# Patient Record
Sex: Female | Born: 1980 | Race: White | Hispanic: No | Smoking: Never smoker
Health system: Southern US, Community
[De-identification: ages and names within clinical notes are randomized; demographics above are authoritative.]

## PROBLEM LIST (undated history)

## (undated) DIAGNOSIS — N61 Mastitis without abscess: Secondary | ICD-10-CM

## (undated) DIAGNOSIS — B2790 Infectious mononucleosis, unspecified without complication: Secondary | ICD-10-CM

## (undated) DIAGNOSIS — M436 Torticollis: Secondary | ICD-10-CM

## (undated) DIAGNOSIS — A6920 Lyme disease, unspecified: Secondary | ICD-10-CM

## (undated) DIAGNOSIS — E282 Polycystic ovarian syndrome: Secondary | ICD-10-CM

## (undated) DIAGNOSIS — R55 Syncope and collapse: Secondary | ICD-10-CM

## (undated) DIAGNOSIS — N915 Oligomenorrhea, unspecified: Secondary | ICD-10-CM

## (undated) DIAGNOSIS — N762 Acute vulvitis: Secondary | ICD-10-CM

## (undated) HISTORY — DX: Infectious mononucleosis, unspecified without complication: B27.90

## (undated) HISTORY — DX: Polycystic ovarian syndrome: E28.2

## (undated) HISTORY — PX: TYMPANOSTOMY TUBE PLACEMENT: SHX32

## (undated) HISTORY — DX: Syncope and collapse: R55

## (undated) HISTORY — PX: TUBAL LIGATION: SHX77

## (undated) HISTORY — DX: Acute vulvitis: N76.2

## (undated) HISTORY — PX: WISDOM TOOTH EXTRACTION: SHX21

## (undated) HISTORY — PX: NECK SURGERY: SHX720

## (undated) HISTORY — DX: Lyme disease, unspecified: A69.20

## (undated) HISTORY — DX: Torticollis: M43.6

## (undated) HISTORY — PX: TONSILLECTOMY AND ADENOIDECTOMY: SHX28

## (undated) HISTORY — DX: Oligomenorrhea, unspecified: N91.5

## (undated) HISTORY — DX: Mastitis without abscess: N61.0

---

## 2005-11-09 ENCOUNTER — Other Ambulatory Visit: Admission: RE | Admit: 2005-11-09 | Discharge: 2005-11-09 | Payer: Self-pay | Admitting: Obstetrics and Gynecology

## 2006-08-31 ENCOUNTER — Inpatient Hospital Stay (HOSPITAL_COMMUNITY): Admission: AD | Admit: 2006-08-31 | Discharge: 2006-08-31 | Payer: Self-pay | Admitting: Obstetrics and Gynecology

## 2006-08-31 ENCOUNTER — Inpatient Hospital Stay (HOSPITAL_COMMUNITY): Admission: AD | Admit: 2006-08-31 | Discharge: 2006-09-03 | Payer: Self-pay | Admitting: Obstetrics and Gynecology

## 2006-09-08 ENCOUNTER — Inpatient Hospital Stay (HOSPITAL_COMMUNITY): Admission: AD | Admit: 2006-09-08 | Discharge: 2006-09-08 | Payer: Self-pay | Admitting: Obstetrics and Gynecology

## 2006-09-20 ENCOUNTER — Inpatient Hospital Stay (HOSPITAL_COMMUNITY): Admission: AD | Admit: 2006-09-20 | Discharge: 2006-09-22 | Payer: Self-pay | Admitting: Obstetrics and Gynecology

## 2006-09-22 ENCOUNTER — Ambulatory Visit: Payer: Self-pay | Admitting: Infectious Diseases

## 2008-08-11 ENCOUNTER — Observation Stay (HOSPITAL_COMMUNITY): Admission: AD | Admit: 2008-08-11 | Discharge: 2008-08-11 | Payer: Self-pay | Admitting: Obstetrics and Gynecology

## 2008-08-21 ENCOUNTER — Inpatient Hospital Stay (HOSPITAL_COMMUNITY): Admission: AD | Admit: 2008-08-21 | Discharge: 2008-08-23 | Payer: Self-pay | Admitting: Obstetrics and Gynecology

## 2008-09-17 ENCOUNTER — Inpatient Hospital Stay (HOSPITAL_COMMUNITY): Admission: AD | Admit: 2008-09-17 | Discharge: 2008-09-18 | Payer: Self-pay | Admitting: Obstetrics and Gynecology

## 2008-09-18 ENCOUNTER — Inpatient Hospital Stay (HOSPITAL_COMMUNITY): Admission: AD | Admit: 2008-09-18 | Discharge: 2008-09-20 | Payer: Self-pay | Admitting: Obstetrics and Gynecology

## 2008-09-26 ENCOUNTER — Observation Stay (HOSPITAL_COMMUNITY): Admission: AD | Admit: 2008-09-26 | Discharge: 2008-09-27 | Payer: Self-pay | Admitting: Obstetrics and Gynecology

## 2009-07-23 DIAGNOSIS — R42 Dizziness and giddiness: Secondary | ICD-10-CM | POA: Insufficient documentation

## 2009-07-23 DIAGNOSIS — R0602 Shortness of breath: Secondary | ICD-10-CM | POA: Insufficient documentation

## 2009-07-23 DIAGNOSIS — N61 Mastitis without abscess: Secondary | ICD-10-CM | POA: Insufficient documentation

## 2009-07-23 DIAGNOSIS — R002 Palpitations: Secondary | ICD-10-CM | POA: Insufficient documentation

## 2009-08-07 ENCOUNTER — Ambulatory Visit: Payer: Self-pay | Admitting: Cardiology

## 2009-08-31 ENCOUNTER — Ambulatory Visit: Payer: Self-pay

## 2009-08-31 ENCOUNTER — Ambulatory Visit: Payer: Self-pay | Admitting: Cardiology

## 2009-08-31 ENCOUNTER — Encounter: Payer: Self-pay | Admitting: Cardiology

## 2009-08-31 ENCOUNTER — Ambulatory Visit (HOSPITAL_COMMUNITY): Admission: RE | Admit: 2009-08-31 | Discharge: 2009-08-31 | Payer: Self-pay | Admitting: Cardiology

## 2009-12-04 ENCOUNTER — Inpatient Hospital Stay (HOSPITAL_COMMUNITY): Admission: AD | Admit: 2009-12-04 | Discharge: 2009-12-06 | Payer: Self-pay | Admitting: Obstetrics and Gynecology

## 2009-12-04 ENCOUNTER — Encounter (INDEPENDENT_AMBULATORY_CARE_PROVIDER_SITE_OTHER): Payer: Self-pay | Admitting: Obstetrics and Gynecology

## 2010-04-13 NOTE — Assessment & Plan Note (Signed)
Summary: np6/[redacted] weeks pregnant/dizziness/sob   Visit Type:  new pt visit Referring Provider:  Dierdre Forth Primary Provider:  Ivery Quale  CC:  palpitations...dizziness/headache but says this happens mostly when resting or sitting...pt is 23-[redacted] wks gestation.Marland Kitchen  History of Present Illness: [name] is a delightful 30 year old married white female, referred by Dr. Pennie Rushing, for palpitations.  After her second delivery last year, she had had a PICC line placed for prolonged antibiotics for mastitis. After the placement of this line, which was documented to be in the superior vena cava , she began to have sharp chest pain and palpitations. I reviewed the chest x-ray report today and it was showing the [facility] line in the superior vena cava. I reviewed that with her today.  After the PICC line was removed she stopped having the chest discomfort continued to have intermittent palpitations. They describe a spontaneous, lasting several seconds to a minute, with no associated symptoms of chest discomfort, shortness of breath, or presyncope.  Prior to pregnancy, she did exercise. She had no problems with exercise.  She drinks very little caffeine. She does not smoke or use any alcohol.  There is no history of heart disease or valvular disease.  Her thyroid was checked back in November and was normal.  CBC was obtained February 2011. Hemoglobin was 12.  Current Medications (verified): 1)  Prenatal Vitamins 0.8 Mg Tabs (Prenatal Multivit-Min-Fe-Fa) .Marland Kitchen.. 1 Tab Once Daily  Allergies (verified): 1)  ! Augmentin  Past History:  Past Medical History: Last updated: 07/23/2009 CORONARY ARTERY DISEASE, FAMILY HX (ICD-V17.3) PALPITATIONS (ICD-785.1) SHORTNESS OF BREATH (ICD-786.05) DIZZINESS (ICD-780.4) MASTITIS (ICD-611.0) FM HX OF OTHER CHRONIC RESPIRATORY CONDITIONS (ICD-V17.6)     Past Surgical History: Last updated: 07/23/2009 Gyn surgery  Family History: Last updated:  07/23/2009 Family History of Coronary Artery Disease:  Family History of Diabetes:  Family History of Hypertension:  Fx h/o thyroid disfunction Family History of Cancer:   Review of Systems       negative other than history of present illness  Vital Signs:  Patient profile:   30 year old female Height:      66 inches Weight:      162 pounds BMI:     26.24 Pulse rate:   89 / minute Pulse (ortho):   96 / minute Pulse rhythm:   irregular BP sitting:   102 / 67  (left arm) BP standing:   98 / 70 Cuff size:   large  Vitals Entered By: [name], CMA (Aug 07, 2009 11:13 AM)  Serial Vital Signs/Assessments:  Time      Position  BP       Pulse  Resp  Temp     By 11:19 AM  Lying LA  102/67   91                    [name], CMA 11:19 AM  Sitting   100/64   89                    [name], CMA 11:21 AM  Standing  98/70    96                    [name], CMA 11:22 AM  Standing  104/61   97                    [name], CMA 11:24 AM  Standing  108/65  [address]                    [name], CMA  Comments: 11:19 AM no sxms By: [name], CMA  11:19 AM heads feels fuzzy By: [name], CMA  11:21 AM no sxms By: [name], CMA  11:22 AM no sxms By: [name], CMA  11:24 AM no sxms By: [name], CMA    Physical Exam  General:  Well developed, well nourished, in no acute distress. Head:  normocephalic and atraumatic Eyes:  PERRLA/EOM intact; conjunctiva and lids normal. Neck:  Neck supple, no JVD. No masses, thyromegaly or abnormal cervical nodes. Chest Noelene Gang:  no deformities or breast masses noted Lungs:  Clear bilaterally to auscultation and percussion. Heart:  PMI nondisplaced, normal S1-S2, regular rate and rhythm, no gout Abdomen:  pregnant, good bowel Msk:  Back normal, normal gait. Muscle strength and tone normal. Pulses:  pulses normal in all 4 extremities Extremities:  No clubbing or cyanosis. Neurologic:  Alert and oriented x 3. Skin:   Intact without lesions or rashes. Psych:  Normal affect.   EKG  Procedure date:  08/07/2009  Findings:      normal sinus rhythm, essentially normal EKG  Impression & Recommendations:  Problem # 1:  PALPITATIONS (ICD-785.1) Assessment New I suspect these are benign. Will obtain a 2-D echocardiogram to rule out any structural or functional heart disease. If this is normal, reassurance has been given. See her back on a p.r.n. basis. Orders: EKG w/ Interpretation (93000) Echocardiogram (Echo)  Problem # 2:  CORONARY ARTERY DISEASE, FAMILY HX (ICD-V17.3) Assessment: Unchanged  Problem # 3:  DIZZINESS (ICD-780.4) This is a nonspecific complaint and she is not orthostatic. I've asked her stay well hydrated and have reviewed orthostatic precautions.  Patient Instructions: 1)  Your physician recommends that you schedule a follow-up appointment in: AS NEEDED 2)  Your physician recommends that you continue on your current medications as directed. Please refer to the Current Medication list given to you today. 3)  Your physician has requested that you have an echocardiogram.  Echocardiography is a painless test that uses sound waves to create images of your heart. It provides your doctor with information about the size and shape of your heart and how well your heart's chambers and valves are working.  This procedure takes approximately one hour. There are no restrictions for this procedure.

## 2010-05-27 LAB — CBC
Hemoglobin: 9.4 g/dL — ABNORMAL LOW (ref 12.0–15.0)
MCH: 31 pg (ref 26.0–34.0)
MCHC: 34.3 g/dL (ref 30.0–36.0)
Platelets: 205 10*3/uL (ref 150–400)
RBC: 3.04 MIL/uL — ABNORMAL LOW (ref 3.87–5.11)
RBC: 3.75 MIL/uL — ABNORMAL LOW (ref 3.87–5.11)
WBC: 7.2 10*3/uL (ref 4.0–10.5)
WBC: 8.4 10*3/uL (ref 4.0–10.5)

## 2010-05-27 LAB — RPR: RPR Ser Ql: NONREACTIVE

## 2010-06-20 LAB — CBC
Hemoglobin: 13.2 g/dL (ref 12.0–15.0)
Hemoglobin: 10.7 g/dL — ABNORMAL LOW (ref 12.0–15.0)
MCHC: 34.5 g/dL (ref 30.0–36.0)
MCHC: 34.6 g/dL (ref 30.0–36.0)
MCV: 90.9 fL (ref 78.0–100.0)
MCV: 92.4 fL (ref 78.0–100.0)
Platelets: 209 10*3/uL (ref 150–400)
Platelets: 228 10*3/uL (ref 150–400)
RBC: 4.21 MIL/uL (ref 3.87–5.11)
RBC: 3.18 MIL/uL — ABNORMAL LOW (ref 3.87–5.11)
RDW: 13.7 % (ref 11.5–15.5)
RDW: 14 % (ref 11.5–15.5)
WBC: 8.8 10*3/uL (ref 4.0–10.5)
WBC: 7.5 10*3/uL (ref 4.0–10.5)

## 2010-06-20 LAB — DIFFERENTIAL
Basophils Absolute: 0 10*3/uL (ref 0.0–0.1)
Basophils Absolute: 0 10*3/uL (ref 0.0–0.1)
Basophils Relative: 0 % (ref 0–1)
Basophils Relative: 0 % (ref 0–1)
Basophils Relative: 0 % (ref 0–1)
Eosinophils Absolute: 0.1 10*3/uL (ref 0.0–0.7)
Eosinophils Absolute: 0.1 10*3/uL (ref 0.0–0.7)
Eosinophils Relative: 1 % (ref 0–5)
Lymphs Abs: 1.8 10*3/uL (ref 0.7–4.0)
Monocytes Absolute: 0.6 10*3/uL (ref 0.1–1.0)
Myelocytes: 0 %
Neutro Abs: 6.5 10*3/uL (ref 1.7–7.7)
Neutro Abs: 5 10*3/uL (ref 1.7–7.7)
Neutrophils Relative %: 74 % (ref 43–77)
Neutrophils Relative %: 67 % (ref 43–77)
Neutrophils Relative %: 79 % — ABNORMAL HIGH (ref 43–77)

## 2010-06-20 LAB — CREATININE, SERUM
Creatinine, Ser: 0.57 mg/dL (ref 0.4–1.2)
GFR calc Af Amer: >60 mL/min (ref >60)

## 2010-06-20 LAB — WOUND CULTURE

## 2010-06-21 LAB — CBC
HCT: 25.1 % — ABNORMAL LOW (ref 36.0–46.0)
Hemoglobin: 12.1 g/dL (ref 12.0–15.0)
Hemoglobin: 8.9 g/dL — ABNORMAL LOW (ref 12.0–15.0)
MCV: 93 fL (ref 78.0–100.0)
MCV: 93.8 fL (ref 78.0–100.0)
Platelets: 164 10*3/uL (ref 150–400)
RBC: 2.58 MIL/uL — ABNORMAL LOW (ref 3.87–5.11)
RBC: 2.68 MIL/uL — ABNORMAL LOW (ref 3.87–5.11)
RDW: 14.3 % (ref 11.5–15.5)
WBC: 11.7 10*3/uL — ABNORMAL HIGH (ref 4.0–10.5)
WBC: 9.5 10*3/uL (ref 4.0–10.5)

## 2010-06-21 LAB — RPR: RPR Ser Ql: NONREACTIVE

## 2010-07-27 NOTE — Discharge Summary (Signed)
[name], [name]                 ACCOUNT NO.:  [id number]   MEDICAL RECORD NO.:  [id number]          PATIENT TYPE:  INP   LOCATION:  9314                          [facility]ILITY:  [facility]   PHYSICIAN:  Hal Morales, M.D.DATE OF BIRTH:  1980/12/11   DATE OF ADMISSION:  09/20/2006  DATE OF DISCHARGE:  09/22/2006                               DISCHARGE SUMMARY   DISCHARGING PHYSICIAN:  Erie Noe P. Haygood, MD.   ADMISSION DIAGNOSIS:  Acute mastitis.   DISCHARGE DIAGNOSIS:  Acute mastitis.   HOSPITAL PROCEDURES:  IV antibiotics.   HOSPITAL COURSE:  The patient was admitted with recurrent acute mastitis  on September 20, 2006, with bilateral breast erythema and induration of the  left breast with fever of 103.7.  She was admitted for triple  antibiotics of Ampicillin, Gentamicin, and Clindamycin, and also treated  with Motrin and Tylenol for fever.  She had cultures, which grew out  some gram-negative rods and was changed to Vancomycin.  She had red man  syndrome with the first dose of Vancomycin, which improved with  Vistaril.  On September 21, 2006, she was feeling better, but the left breast  still had erythema, fever was down, and she continued to receive her IV  antibiotics.  White blood cell count was 10 and hemoglobin was 9.7.  She  was seen by Infectious Disease on September 22, 2006, who felt like her  progress was significant and that her mastitis was improving, and he  recommended discharge home with transition to Bactrim-DS b.i.d. for 14  days for further treatment.  On the day of discharge, she was afebrile  x48 hours, neck was supple with no lymph nodes, the right breast was  soft with slight erythema of the nipple, the left breast was soft with  slight erythema of the nipple and at the 8 o'clock position with no  induration, the abdomen was soft and nontender, the incision was clean,  dry, and intact, breast cultures now showed rare gram-positive rods and  rare gram-positive cocci, and  extremities within normal limits.  She was  deemed to have received full benefit of her hospital stay and was  discharged home.   DISCHARGE MEDICATIONS:  1. Bactrim-DS one p.o. b.i.d. x14 days.  2. Diflucan 150 mg p.o. x1 p.r.n. vaginal yeast infection.   DISCHARGE LABORATORY DATA:  Cultures as listed before, and white blood  cell count 10.0, hemoglobin 9.7, platelets 280, creatinine 0.65.   DISCHARGE INSTRUCTIONS:  Spacing out breast pumping to wean from  breastfeeding per patient's desire, monitoring of temperature, and  patient is to call if symptoms worsen or recur.   DISCHARGE FOLLOWUP:  In two weeks or p.r.n.   CONDITION ON DISCHARGE:  Good.      Marie L. Williams, C.N.M.      Hal Morales, M.D.  Electronically Signed    MLW/MEDQ  D:  09/22/2006  T:  09/22/2006  Job:  [id number]

## 2010-07-27 NOTE — Discharge Summary (Signed)
[name], [name]                 ACCOUNT NO.:  [id number]   MEDICAL RECORD NO.:  [id number]          PATIENT TYPE:  INP   LOCATION:  9144                          [facility]ILITY:  [facility]   PHYSICIAN:  Janine Limbo, M.D.DATE OF BIRTH:  [birth year]   DATE OF ADMISSION:  08/31/2006  DATE OF DISCHARGE:  09/03/2006                               DISCHARGE SUMMARY   ADMITTING DIAGNOSES:  1. Intrauterine pregnancy at 40-5/7 weeks.  2. Early labor.   DISCHARGE DIAGNOSES:  1. Intrauterine pregnancy at term.  2. Failure to progress in labor.   PROCEDURES:  1. Primary low transverse cesarean section.  2. Epidural anesthesia.   HOSPITAL COURSE:  Ms. Goyal is a 30 year old gravida 1, para 0, at 23-  3/7 weeks who presented to the office on the morning of August 31, 2006,  with contractions throughout the night.  Cervix at that time was 3 cm,  80%, vertex at a -1 station.  She had a reactive NST and was re-  evaluated approximately 1-2 hours later, was noted to be 3-4, 90%,  vertex at a -1 station.  At that point contractions were every 2 to 2-  1/2 minutes, moderate quality, fetal heart rate was reactive, the  patient did desire to go home.  Risks and benefits of this were reviewed  with the patient and the decision was made to allow the patient to go  home and to return in no longer than 3-4 hours.  She returned then at  approximately 4:20, cervix was 4-5 cm at that time, penicillin  prophylaxis was begun for beta strep and she was admitted.  Her  pregnancy had been remarkable for:  1. Irregular cycles.  2. Questionable PCOS.  3. History of a previa that had resolved.  4. 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  5. Positive Group B Strep.   Through the rest of the evening contractions continued to increase in  quality, patient continued to decline artificial rupture of membranes.  By 7:30, cervix was 5-6, 95% at a 0 station.  Eventually she elected to  allow rupture of membranes to occur, which happened at 9:08 p.m.  At  that time she was fixed, 95% with a 0 station.  She had had somewhat of  a mild arrest of labor at that time, fluid was slightly yellow, blood-  tinged.  She did have some variable decels, IUPC was placed.  [place]  units were noted to be adequate over time.  An IUPC was placed for amnio  infusion.  By 12:50 a.m., patient was 9, completely effaced with vertex  at a 0 station, fetal heart rate was reactive.  Adequate labor was  established.  By 2:15, patient had a fever of 101, she had had an  epidural placed, Unasyn was begun.  By 4:50, cervix was still 9 cm,  complete, with vertex at a -1 station.  Fetal heart rate began to be  tachycardic, fever was continuing, adequate labor had been established.  C-section delivery was recommended.  Dr. Pennie Rushing came  in and also  consulted with the patient.  Decision was made to proceed with C-  section.  The patient was taken to the operating room where a primary  low transverse cesarean section was performed by Dr. Pennie Rushing under  existing epidural anesthesia.  Findings were a viable female by the name  of Franky Macho.  Weight 7 pounds, 8 ounces, Apgars were 8 and 9.  Infant was  taken to the full-term nursery, mother was taken to recovery in good  condition.  By postop day 1 patient was doing well, just having some  slight tearful episodes, given that her expectations for her labor were  different than her outcomes.  However, she was able to articulate that  she was pleased to have a healthy baby.  Breastfeeding was going well.  She was hesitant to take Percocet, this issue was reviewed.  Hemoglobin  was 9.4 down from 13.2, white blood cell count was 8.8 and platelet  count was 151,000.  Her physical exam was within normal limits.  By  postop day 2, patient was continuing to do well overall, although she  was very tired of being in the hospital.  She desired early discharge,  this was  okayed with pediatrician.  The patient denied any postpartum  depression at this time and she planned abstinence at present for birth  control.  Her physical exam was within normal limits, her incision was  clean, dry and intact with subcuticular sutures and Steri-Strips  applied.  Extremities were within normal limits.  She was deemed to have  received full benefit of her hospital stay and was discharged home.   DISCHARGE INSTRUCTIONS:  Per [facility] handout.   DISCHARGE MEDICATIONS:  1. Motrin 600 mg p.o. q.6h. p.r.n. pain.  2. Percocet 1-2 p.o. q.3-4 hours p.r.n. pain.   DISCHARGE FOLLOWUP:  Will occur in 4 weeks at [facility].   I did review with the patient risks of postpartum depression and she is  to call with any issues.      Renaldo Reel Emilee Hero, C.N.M.      Janine Limbo, M.D.  Electronically Signed    VLL/MEDQ  D:  09/03/2006  T:  09/03/2006  Job:  [id number]

## 2010-07-27 NOTE — H&P (Signed)
[name], [name]                 ACCOUNT NO.:  [id number]   MEDICAL RECORD NO.:  [id number]          PATIENT TYPE:  INP   LOCATION:  9302                          [facility]   PHYSICIAN:  Osborn Coho, M.D.   DATE OF BIRTH:  Oct 14, 1980   DATE OF ADMISSION:  09/26/2008  DATE OF DISCHARGE:                              HISTORY & PHYSICAL   Dr. Su Hilt is the doctor of record.   A 30 year old gravida 2, para 2 who presents following a successful VBAC  of an 8 pound infant to [facility] due to chronic mastitis.  She is  4 weeks' postpartum, was seen September 18, 2008 in [facility] for mastitis and was  admitted and observed.  At that point, IV antibiotics, vancomycin was  given and she was discharged home.  She was also treated by  dermatologist in [place] with IM Rocephin and Diflucan.  Culture  of the breast milk shows positive GBS.   ADMISSION DIAGNOSIS:  Chronic mastitis 4 weeks' postpartum.   DISCHARGE DIAGNOSIS:  Unknown.   LABORATORY DATA:  She is A positive, antibody screen negative, VDRL  within normal limits, rubella immune, HBsAg negative, and HIV negative.   On admission, vital signs were 98.6, 86, 20; blood pressure was 117/64,  97.6, pulse 84, 18, and blood pressure 120/50.  The patient states she  is still pumping her breasts, states pain is 4 on a 1-10 pain scale,  pain more on the right breast.   PHYSICAL EXAMINATION:  LUNGS:  Clear bilaterally.  HEART:  Regular rate without murmur.  BREASTS:  The right breast is soft, full, small reddened area around the  areola at 10 o'clock, nipples erect and warm to touch.  The left breast,  no signs of infection, nipples were erect.  Her bleeding is minimal, she  states.  ABDOMEN:  No abdominal pain.  EXTREMITIES:  Negative Homans.   DRUG ALLERGIES:  PENICILLIN and AUGMENTIN, which causes itching.   MEDICATIONS:  She has used in the past medications, p.o. Augmentin,  Keflex, Diflucan, and doxycycline.  IV  antibiotics were used,  gentamicin, clindamycin, and vancomycin.   She was seen by Dr. Estanislado Pandy at 2 p.m. this date and sent over for a PICC  line, in no apparent distress.   ASSESSMENT:  Postpartum vaginal birth after cesarean section successful  with chronic mastitis, admitted for intravenous antibiotics.   PLAN:  1. Admit to third floor for observation, IV of normal saline at 75 mL      (pharmacy states that cephalexin is incompatible with Lactated      Ringer).  2. Regular diet as tolerated.  3. Call if temperature above or equal to 101.  4. Tylenol 650 mg q.8 h. p.r.n.  We will start cephalexin 1 g daily.  5. Ambien 10 mg at bedtime if requested.  6. Dr. Su Hilt aware of admission and orders received.  PICC line will      be ordered by the nurses by Interventional Radiology or IV therapy.      Observe and consult as needed.  Jasmine Awe, CNM      Osborn Coho, M.D.  Electronically Signed    JM/MEDQ  D:  09/26/2008  T:  09/27/2008  Job:  [id number]

## 2010-07-27 NOTE — H&P (Signed)
NAMEKERRIANN, [name]                 ACCOUNT NO.:  [id number]   MEDICAL RECORD NO.:  [id number]          PATIENT TYPE:  INP   LOCATION:  9170                          [facility]   PHYSICIAN:  Hal Morales, M.D.DATE OF BIRTH:  01-19-1981   DATE OF ADMISSION:  08/21/2008  DATE OF DISCHARGE:                              HISTORY & PHYSICAL   [name] is a 30 year old gravida 2, para 1, at 39-1/7 weeks who  presents today with uterine contractions every 2-3 minutes for the last  several hours.  She denies any leaking or bleeding and reports positive  fetal movements.   Pregnancy has been remarkable for:  1. Positive group B strep with the patient penicillin allergic but      group B strep sensitive to clindamycin.  2. The patient is a pharmacist.  3. Previous cesarean section secondary to failure to progress and      nonreassuring fetal heart rate at 9 cm.  4. Paracongenital torticollis.  5. Previous breech presentation with a successful version on May 31.   PRENATAL LABORATORIES:  Blood type is A+, Rh antibody negative, VDRL  nonreactive, rubella titer positive, hepatitis B surface antigen  negative, HIV is nonreactive.  GC and Chlamydia cultures were declined.  Cystic fibrosis testing was negative from her previous pregnancy.  Pap  was normal in December of 2008.  I do not see where another Pap was  done.  The patient had a normal first trimester screen and normal AFP.  She had a normal Glucola.  Hemoglobin upon entering the practice was 12.  It was 11.2 at 28 weeks.  She had a positive group B strep culture done  at 36 weeks.  She had repeat done again with a positive report for  sensitivities and a beta strep was sensitive to penicillin, ampicillin,  levofloxacin, vancomycin and clindamycin.  It was resistant to  erythromycin.   HISTORY OF PRESENT PREGNANCY:  The patient entered care at approximately  9 weeks.  She desired first trimester screen.  She was leaning toward  a  VBAC from the beginning.  She had severe mastitis with hospitalization 2  weeks postpartum and was treated with vancomycin for which she had a red  man syndrome.  She declined flu vaccine.  She had had some first  trimester spotting that resolved in the first trimester.  Ultrasound for  first trimester screen showed anterior placenta and a positive nasal  bone.  EDC by first trimester screen was August 27, 2008.  At 18 weeks,  she had another ultrasound showing posterior placenta.  Dating criteria  was more consistent at her 12-week ultrasound.  She had a normal  Glucola.  She had some perineal irritation from a new soap at 25 weeks.  This was treated with triamcinolone.  VBAC consent form was signed, and  the patient had a visit with Dr. Estanislado Pandy to review the questions.  She  was having some fatigue at 31 weeks.  Positive group B strep was noted  as previously described.  She was frank breech at 37 weeks.  Scheduled  version was done on May 31 and was completed without difficulty.   OBSTETRICAL HISTORY:  In 2008, she had a primary low transverse cesarean  section for a female infant, weight 7 pounds 8 ounces at 40 and 3/7 weeks.  She was in labor 27 hours.  She had epidural anesthesia.  She got to 9  cm and had some combination of failure to progress and fetal distress.  She had a positive group B strep with her first pregnancy.   MEDICAL HISTORY:  1. She reports the usual childhood illnesses.  2. She is a previous [employer] user.  3. She had a UTI x1.  4. She had a fractured elbow at age 87.  5. She was hospitalized 2 weeks postpartum for severe mastitis      following the birth of her baby.   ALLERGIES:  1. SHE IS ALLERGIC TO PENICILLIN WHICH CAUSES HIVES.  2. AUGMENTIN WHICH ALSO CAUSES A RASH.  3. SHE ALSO HAD A RED MAN REACTION TO VANCOMYCIN.   SURGICAL HISTORY:  1. A primary low transverse cesarean section.  2. Tubes in her ears as a child.  3. Adenoids were  removed.  4. She also had right neck surgery.  5. Wisdom teeth removed.   FAMILY HISTORY:  Her paternal grandfather had heart disease.  Father has  hypertension.  Maternal grandmother and paternal grandmother had  emphysema.  Paternal grandfather had borderline diabetes.  Paternal  grandmother had hypothyroidism.  Paternal grandmother had lung cancer.  Maternal grandfather had lung cancer.  Mother and sister have migraines.  Paternal grandmother and paternal grandfather were smokers.   GENETIC HISTORY:  Remarkable for the patient's uncle having a heart  murmur and the patient's first cousin having twins.   SOCIAL HISTORY:  The patient is married to the father of the baby.  He  is involved and supportive.  His name is Naysa Puskas.  The patient is  Caucasian, of the [place] faith.  She is graduate educated.  She is a  Art therapist.  The patient's husband is also graduate educated.  He  is a physical therapist and part owner of a restaurant.  She has been  followed by the certified nurse midwife service of [place].  She denies any alcohol, drug or tobacco use during this pregnancy.   PHYSICAL EXAMINATION:  VITAL SIGNS:  Stable.  The patient is febrile.  HEENT: Within normal limits.  LUNGS:  Breath sounds are clear.  HEART:  Regular rate and rhythm without murmur.  BREASTS:  Soft and nontender.  ABDOMEN:  Fundal height is approximately 39 cm.  Estimated fetal weight  is 7 to 7-1/2 pounds.  Uterine contractions every 2-3 minutes, moderate  to strong quality.  PELVIC:  Cervix is 4+, 100% vertex at a zero station with intact bag of  water.  Fetal heart rate is reassuring with a negative spontaneous CST.  EXTREMITIES:  Deep tendon reflexes are 2+ without clonus.  There is  trace edema noted.   IMPRESSION:  1. Intrauterine pregnancy at 39-1/7 weeks.  2. Early active labor.  3. Positive group B strep with penicillin allergic, but culture      sensitive to  clindamycin.  4. Previous cesarean section with desire for vaginal birth after      cesarean section.   PLAN:  1. Admit to birthing suite per consult with Dr. Pennie Rushing as attending      physician.  2. Routine certified nurse midwife  orders.  3. The patient desires VBAC.  Consent on chart.  Risks and benefits      were reviewed including failure of trial of      labor, need for C-section, uterine rupture with maternal fetal      compromise.  The patient and her husband seem to understand these      risks and wish to proceed with VBAC trial.  4. The patient desires epidural.      Renaldo Reel. Emilee Hero, C.N.M.      Hal Morales, M.D.  Electronically Signed    VLL/MEDQ  D:  08/21/2008  T:  08/21/2008  Job:  [id number]

## 2010-07-27 NOTE — H&P (Signed)
NAMETELECIA, [name]                 ACCOUNT NO.:  [id number]   MEDICAL RECORD NO.:  [id number]          PATIENT TYPE:  INP   LOCATION:  9173                          [facility]ILITY:  [facility]   PHYSICIAN:  Hal Morales, M.D.DATE OF BIRTH:  1980-09-18   DATE OF ADMISSION:  08/31/2006  DATE OF DISCHARGE:                              HISTORY & PHYSICAL   This is a 30 year old gravida 1, para 0, at 40-3/7 weeks who presents in  labor.  She was seen several hours ago for a labor check and requested  to go home.  Her cervix at that time was 3-4 cm and is now 4-5 cm and  she is presenting for admission.  Pregnancy has been followed by the  midwife service and remarkable for:  1. Irregular cycles.  2. Questionably with PCOS.  3. History of previa which is now resolved.  4. 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  5. Group B strep positive.   ALLERGIES:  None.   OB HISTORY:  Patient is primigravida.   MEDICAL HISTORY:  1. Remarkable for being told in the past she has PCOS.  2. She also has a history of torticollis as a baby which was      surgically corrected.   SURGICAL HISTORY:  Remarkable for:  1. Tubes in her ears.  2. Adenoids removed.  3. Neck surgery.  4. Wisdom teeth.   FAMILY HISTORY:  Remarkable for:  1. Grandfather with heart disease.  2. Father with hypertension.  3. Two grandmothers with emphysema.  4. Grandfather with diabetes.  5. Grandmother with hyperthyroidism.  6. Grandmother with breast cancer.  7. Grandfather with lung cancer.   GENETIC HISTORY:  Remarkable for an uncle with heart murmur and a first  cousin with twins.   SOCIAL HISTORY:  1. Patient is married to Leanna Sato who is involved and supportive.  2. She is of the [place] faith.  3. She denies any alcohol, tobacco or drug use.  4. She works as a Teacher, early years/pre.   PRENATAL LABS:  Hemoglobin 13.7, platelets 215,000 and blood type A  positive.  Antibody screen negative, RPR  nonreactive, rubella immune,  hepatitis negative, HIV negative, Pap test normal, gonorrhea negative,  Chlamydia negative, cystic fibrosis negative.   HISTORY OF CURRENT PREGNANCY:  1. Patient entered care at [redacted] weeks gestation.  2. She had a 1st trimester screen that was normal.  3. She had an ultrasound at 19 weeks for anatomy which was normal.  4. She declined followup AFP.  5. She had a Glucola at 27 weeks that was normal.  6. She was Group B strep positive at term.   OBJECTIVE DATA:  VITAL SIGNS:  Stable, afebrile.  HEENT:  Within normal limits.  THYROID:  Normal, not enlarged.  CHEST:  Clear to auscultation.  HEART:  Regular rate and rhythm.  ABDOMEN:  Gravid at 39 cm.  VERTEX:  __________.  CFM:  Shows reactive fetal heart rate with contractions every 2-3  minutes, cervix is now 4-5 cm, 95% effaced, -1 station with a vertex  presentation.  EXTREMITIES:  Within normal limits.   ASSESSMENT:  1. Intrauterine pregnancy at 40-3/7 weeks.  2. Active labor.  3. Group B streptococcus positive.   PLAN:  1. Admit to birthing suite.  2. Routine CNM orders.  3. Group B strep prophylaxis.  4. Further orders to follow.      Marie L. Williams, C.N.M.      Hal Morales, M.D.  Electronically Signed    MLW/MEDQ  D:  08/31/2006  T:  08/31/2006  Job:  [id number]

## 2010-07-27 NOTE — H&P (Signed)
[name], [name]                 ACCOUNT NO.:  [id number]   MEDICAL RECORD NO.:  [id number]          PATIENT TYPE:  INP   LOCATION:  9314                          [facility]ILITY[facility]  [facility]   PHYSICIAN:  Osborn Coho, M.D.   DATE OF BIRTH:  Feb 04, 1981   DATE OF ADMISSION:  09/20/2006  DATE OF DISCHARGE:                              HISTORY & PHYSICAL   This is a 30 year old gravida 1 para 1 who is status post cesarean  section on August 31, 2006 for failure to progress, who presents with left  mastitis.  She was treated for right mastitis 2 weeks ago with Keflex  and did not improve on that and was changed to dicloxacillin.  She  developed left breast pain 1-2 days ago and has been pumping every 2  hours, with worsening fever over the last 24 hours.   ALLERGIES:  None.   OB HISTORY:  Remarkable for a low transverse cesarean section on August 31, 2006 for failure to progress.  She had no complications from that.   MEDICAL HISTORY:  1. PCOS.  2. History of torticollis as a baby, which was surgically corrected.   SURGICAL HISTORY:  1. Tubes in her ears.  2. Adenoids removed.  3. Neck surgery.  4. Wisdom teeth.   FAMILY HISTORY:  Remarkable for a grandfather with heart disease, father  with hypertension, two grandmothers with emphysema, grandfather with  diabetes, grandmother with hyperthyroidism, grandmother with breast  cancer, and grandfather with lung cancer.   GENETIC HISTORY:  Remarkable for an uncle with a heart murmur, and a  first cousin with twins.   SOCIAL HISTORY:  The patient is married to [place], who is involved and  supportive.  She is of [place] faith.  She denies any alcohol,  tobacco, or drug use.  She works as a Teacher, early years/pre.   PRENATAL LABORATORIES:  Hemoglobin 13.7.  Blood type A positive.  Other  tests normal.   HISTORY OF CURRENT PREGNANCY:  The patient entered care at [redacted] weeks  gestation.  Had a normal pregnancy, and presented in labor at 40 weeks,  and did  not progress, and subsequently had a cesarean delivery.   OBJECTIVE DATA:  VITAL SIGNS:  Stable.  Fever is 103.7.  NECK:  Supple, with one auricular node palpable on the left.  BREASTS:  Bilateral breasts with erythema, and induration on the left  breast, with yellow crusting on the left nipple.  Milk is able to be  expressed bilaterally.  ABDOMEN:  Soft and nontender.  GU:  Lochia is scant.  EXTREMITIES:  Within normal limits.  PELVIC:  Deferred.   ASSESSMENT:  1. Recurrent acute mastitis.  2. Nineteen days post cesarean section.   PLAN:  1. Dr. Su Hilt was consulted.  2. We will admit to [facility] for IV antibiotic therapy.  3. Triple antibiotics, to include ampicillin, gentamicin, and      clindamycin.  4. Further orders to follow.      Marie L. Williams, C.N.M.      Osborn Coho, M.D.  Electronically Signed    MLW/MEDQ  D:  09/20/2006  T:  09/20/2006  Job:  [id number]

## 2010-07-27 NOTE — Discharge Summary (Signed)
[name]                 ACCOUNT NO.:  [id number]   MEDICAL RECORD NO.:  [id number]          PATIENT TYPE:  INP   LOCATION:  9302                          [facility]  [facility]   PHYSICIAN:  [name], M.D.   DATE OF BIRTH:  1981-03-11   DATE OF ADMISSION:  09/26/2008  DATE OF DISCHARGE:  09/27/2008                               DISCHARGE SUMMARY   ADMISSION DIAGNOSIS:  Recurrent mastitis.   DISCHARGE DIAGNOSIS:  Recurrent mastitis, untreated.   PROCEDURE:  Insertion of peripherally inserted central catheter line.   HOSPITAL COURSE:  [name] is a 30 year old gravida 2, para 2, who was  admitted due to recurrent right breast mastitis, unresponsive to  outpatient management.  The patient had previously been admitted 1 week  ago for same issues and was discharged home on doxycycline.  At the time  of her admission one week ago, the left breast was the affected breast,  however on Friday prior to admission, the patient began having issues  with her right breast with redness and onset of fever as well as  bodyaches and chills.  The patient was admitted for IV antibiotics and  further evaluation.  A plan was implemented after consult with Dr.  Lina Sayre in Infectious Disease to have the patient obtain a PICC  line in order to receive outpatient IV antibiotics due to persistent  issues with mastitis despite outpatient management.  The patient has  used several oral antibiotics over the past few weeks.  The patient was  afebrile upon admission.  On admission, the patient's WBC 8.8,  hemoglobin 13.2, hematocrit 38.3, platelets 232,000 with a normal  differential.  The patient with no nausea, vomiting, or other complaints  upon admission.   On hospital day #1, the patient underwent placement of a PICC line in  the right upper extremity without difficulty.  The patient remained  afebrile throughout her hospital stay.  It was felt that the patient has  received full benefit of  her stay and was discharged home after home  infusion therapy was arranged with [facility].  The patient's  physical exam remained negative and on hospital day #1, a very faint  amount of redness remained present on the right breast.   DISCHARGE INSTRUCTIONS:  The patient to maintain IV antibiotics per home  healthcare.  The patient will continue with prenatal vitamins daily and  Motrin as needed for pain.   DISCHARGE MEDICATIONS:  1. Rocephin 1 g every 24 hours.  2. Ibuprofen 600 mg p.o. q.6-8 h. p.r.n. pain.  3. Percocet 1-2 tabs every 4-6 hours as need for pain.  The patient is already on the above noted medicines with the except of  Rocephin prior to admission.   FOLLOWUP:  Discharge followup will occur in 2-1/2 weeks at [facility].  The patient was instructed to call to office if she  began experiencing increased redness, fever, or pain in the breast or in  the PICC line insertion area prior to her next visit or discontinuation  of the central line.  [name], CNM      [name], M.D.  Electronically Signed    NOS/MEDQ  D:  09/27/2008  T:  09/28/2008  Job:  [id number]

## 2010-07-27 NOTE — Discharge Summary (Signed)
[name], SCHLOSSER                 ACCOUNT NO.:  [id number]   MEDICAL RECORD NO.:  [id number]          PATIENT TYPE:  INP   LOCATION:  9307                          [facility]   PHYSICIAN:  Janine Limbo, M.D.DATE OF BIRTH:  12/18/1980   DATE OF ADMISSION:  09/18/2008  DATE OF DISCHARGE:  09/20/2008                               DISCHARGE SUMMARY   ADMISSION DIAGNOSIS:  Postpartum mastitis, unresponsive to outpatient  therapy.   DISCHARGE DIAGNOSIS:  Postpartum mastitis, improved.   PROCEDURE:  IV antibiotics.   HOSPITAL COURSE:  Mr. Roberti is a 30 year old gravida 2, para 2, status  post vaginal delivery on August 22, 2008.  The patient with a history of  severe mastitis with her previous pregnancy in 2008, requiring IV  vancomycin after multiple p.o. antibiotic failures.  In the past 2 weeks  prior to admission, the patient had been treated with multiple  antibiotics as well as Diflucan.  The patient is with elevated  temperature on the day of admission.  The patient is with increased  breast complaints as well at the time of the admission.   At the time of admission, the patient's temperature was 100 degrees.  White count 8.8, hemoglobin 11.4, hematocrit 32.9, and platelets  209,000.  The left breast is with 8 x 7 cm area of redness.  Because of  the patient's history, a decision was made for inpatient hospitalization  and IV antibiotics.  A consult was obtained with Dr. Maurice March, Infectious  Disease.  The patient was started on vancomycin at the time of  admission.  From the time of admission to the time of discharge after  her initial temperature of 100 degrees, the patient with no further  elevations in temperature.  The patient is with decreased redness and  the patient reports decreased pain.  Infectious Disease recommended  treatment with doxycycline, which is incompatible with breast feeding.  Therefore, the patient has elected to discontinue breast-feeding due to  the persistence of this infection and recurrent nature of the infection.  On September 20, 2008, day of discharge, the patient remained afebrile and  reported that she was feeling much better.  The patient was tolerating  p.o. liquids and solids without difficulty.  The patient is with no  perineal complaints.  Normal lochia and no abdominal pain.   It was felt the patient had received full benefit of her hospital stay  and was discharged home.   DISCHARGE INSTRUCTIONS:  The patient will call if she has fever greater  than 100.5 and an increased breast firmness or redness.   DISCHARGE MEDICATIONS:  1. Doxycycline 100 mg p.o. b.i.d. for 14 days.  2. Motrin 600 mg 1 tablet every 6 hours as needed for pain.  3. Vicodin 1-2 tablets every 4-6 hours as needed.  4. The patient will try to obtain Dr. Allene Pyo nipple cream to use as      needed.   DISCHARGE FOLLOWUP:  Discharge followup will occur at [facility] in 2 weeks.      [name], CNM  Janine Limbo, M.D.  Electronically Signed    NOS/MEDQ  D:  09/21/2008  T:  09/21/2008  Job:  [id number]

## 2010-07-27 NOTE — Discharge Summary (Signed)
[name], [name]                 ACCOUNT NO.:  [id number]   MEDICAL RECORD NO.:  [id number]          PATIENT TYPE:  INP   LOCATION:  9132                          [facility]   PHYSICIAN:  Hal Morales, M.D.DATE OF BIRTH:  09/18/1980   DATE OF ADMISSION:  08/21/2008  DATE OF DISCHARGE:  08/23/2008                               DISCHARGE SUMMARY   ADMISSION DIAGNOSES:  1. Intrauterine pregnancy at 22 and 1/7 weeks.  2. Early active labor.  3. Positive group B strep, sensitive to clindamycin.  4. Previous cesarean section with desire for vaginal birth after      cesarean section.   DISCHARGE DIAGNOSES:  1. Intrauterine pregnancy at 37 and 2/7 weeks, delivered.  2. Vaginal birth after cesarean section, successful.   HOSPITAL COURSE:  [name] is a 30 year old gravida 2, para 1,  admitted at 60 and 1/7 weeks' gestation with an early active labor.  The  patient's pregnancy remarkable for positive Group B strep, sensitive to  clindamycin, previous cesarean section secondary to failure to progress  and nonreassuring heart rate, previous breech presentation with  successful version on May 31, para congenital torticollis.   Upon admission, the patient with regular uterine contractions.  Initial  cervical exam 4 cm.  The patient again elected to proceed with vaginal  birth after cesarean attempt and the patient had previously been  counseled by MD and had signed consent for sign.  The patient received  an epidural and the patient's labor progressed.  The patient had a  successful vaginal delivery on August 22, 2008.  The patient has  spontaneous vaginal delivery of a viable female infant.  Second-degree  laceration was repaired in the usual fashion.  The patient did have  increased bleeding, immediately postdelivery and was given Cytotec 800  mcg.  Estimated blood loss 450 mL.   On later on postpartum day #0, hemoglobin was noted to be 8.6 and it was  12.1 prior to delivery.   The patient with dizziness with ambulation at  that time as well as sitting.  The patient was treated with IV hydration  and was given option of transfusion due to her symptoms, however, she  declined.  The patient's dizziness resolved with IV hydration.   On postpartum day #1, the patient reported feeling much improved and was  ambulating, voiding, and tolerating p.o. liquids and solids without  difficulty.  The patient denied any significant pain and requested  discharge.  The patient remained afebrile.  Repeat hemoglobin on  postpartum day #1 stable at 8.9.  She was deemed to have received full  benefit of her hospital stay and was discharged home.   Of note, the patient with Clinical Social Work consult due to history of  anxiety and relationship issues, however, at that time the patient did  not express any significant issues.   DISCHARGE INSTRUCTIONS:  Per [facility] handout.   DISCHARGE MEDICATIONS:  1. Maxaron forte 1 tablet daily.  2. Prenatal vitamin 1 tablet daily.  3. Motrin 600 mg 1 tablet every 6 hours as needed  for pain.  4. Tylox 1-2 tablets every 4-6 hours as needed for pain.   Discharge followup will occur at [facility] in 6 weeks.  The patient undecided regarding method of contraception and risks,  benefits, alternatives were discussed with the patient prior to her  discharge and will discuss that her at 6-week visit.      [name], CNM      Hal Morales, M.D.  Electronically Signed    NOS/MEDQ  D:  09/21/2008  T:  09/21/2008  Job:  [id number]

## 2010-07-27 NOTE — Op Note (Signed)
[name], [name]                 ACCOUNT NO.:  [id number]   MEDICAL RECORD NO.:  [id number]          PATIENT TYPE:  INP   LOCATION:  9144                          [facility]   PHYSICIAN:  Hal Morales, M.D.DATE OF BIRTH:  1980-08-03   DATE OF PROCEDURE:  09/01/2006  DATE OF DISCHARGE:                               OPERATIVE REPORT   PREOPERATIVE DIAGNOSIS:  Intrauterine pregnancy at term, failure to  progress in labor.   POSTOPERATIVE DIAGNOSIS:  Intrauterine pregnancy at term, failure to  progress in labor.   OPERATION:  Primary low transverse cesarean section.   SURGEON:  Dr. Dierdre Forth   FIRST ASSISTANT:  Wynelle Bourgeois, certified nurse midwife.   ANESTHESIA:  Epidural.   ESTIMATED BLOOD LOSS:  750 mL.   COMPLICATIONS:  None.   FINDINGS:  The patient was delivered of a female infant whose name is  Franky Macho, weighing 7 pounds 8 ounces with Apgars of 8 and 9 at 1 and 5  minutes respectively.  The  uterus, tubes and ovaries were normal for  the gravid state.   PROCEDURE:  The patient was taken to the operating room after  appropriate identification and after a discussion had been held with the  patient and her husband concerning the indications for the procedure as  well as the risks involved which include but are not limited to  anesthesia, bleeding, infection, damage to adjacent organs.  The patient  had her labor epidural and Foley catheter in place.  She was placed on  the operating table and her labor epidural dosed for surgical  anesthesia.  The abdomen was prepped with multiple layers of Betadine  and draped as a sterile field.  After assurance of adequate anesthesia  the skin was infiltrated with a dose of quarter percent Marcaine 15 mL.  Transverse incision was made in the abdomen and the abdomen opened in  layers.  The peritoneum was entered and the bladder blade placed.  The  uterus was incised approximately 2 cm above the uterovesical fold and  that  incision taken laterally on either side bluntly.  The infant was  delivered from the occiput transverse position and after having the  nares and pharynx suctioned and the cord clamped and cut was handed off  to the awaiting pediatricians.  Meconium-stained amniotic fluid was  noted and a DeLee suction was used.  The placenta was noted to have  separated from the uterus and was removed from the operative field.  It  was given to the [facility] Cord Blood [facility] staff for cord blood bank  collection.  The uterus was then closed with a running interlocking  suture of 0 Vicryl.  An imbricating suture of 0 Vicryl was then placed  with adequate hemostasis.  Copious irrigation was carried out.  The  abdominal peritoneum was closed with a running suture of 2-0 Vicryl.  The rectus muscles were reapproximated in the midline with figure-of-  eight suture of 2-0 Vicryl.  The rectus fascia was closed with running  suture of 0 Vicryl and reinforced on either side of midline  with figure-  of-eight sutures of 0 Vicryl.  The subcutaneous tissue was made  hemostatic with Bovie cautery and irrigated.  The skin incision was  closed with a subcuticular suture of 3-0 Monocryl.  Steri-Strips were  applied.  A sterile dressing applied.  The patient was taken from the  operating room to  the recovery room in satisfactory condition having tolerated the  procedure well with sponge and instrument counts correct.   SPECIMEN:  The placenta went to birthing suite after cord blood  collection.      Hal Morales, M.D.  Electronically Signed     VPH/MEDQ  D:  09/01/2006  T:  09/01/2006  Job:  [id number]

## 2010-12-28 LAB — DIFFERENTIAL
Basophils Relative: 0
Eosinophils Absolute: 0.2
Lymphs Abs: 1.9
Monocytes Absolute: 0.5
Monocytes Relative: 5

## 2010-12-28 LAB — WOUND CULTURE

## 2010-12-28 LAB — CREATININE, SERUM: GFR calc Af Amer: >60

## 2010-12-28 LAB — CBC
HCT: 28.7 — ABNORMAL LOW
MCHC: 33.8
MCV: 91.8
Platelets: 280
RDW: 13.7

## 2010-12-28 LAB — ANAEROBIC CULTURE

## 2010-12-29 LAB — CBC
HCT: 27.4 — ABNORMAL LOW
Hemoglobin: 13.2
Hemoglobin: 9.4 — ABNORMAL LOW
MCHC: 34.4
MCHC: 34.5
MCV: 89.9
MCV: 91.2
Platelets: 151
RBC: 3.01 — ABNORMAL LOW
RDW: 13.7
RDW: 13.8
WBC: 8.8

## 2010-12-29 LAB — RPR: RPR Ser Ql: NONREACTIVE

## 2011-01-04 ENCOUNTER — Encounter: Payer: Self-pay | Admitting: Internal Medicine

## 2011-01-05 ENCOUNTER — Encounter: Payer: Self-pay | Admitting: Internal Medicine

## 2011-01-20 ENCOUNTER — Encounter: Payer: Self-pay | Admitting: Physician Assistant

## 2011-01-20 ENCOUNTER — Encounter: Payer: Self-pay | Admitting: *Deleted

## 2011-01-21 ENCOUNTER — Encounter: Payer: Self-pay | Admitting: Physician Assistant

## 2011-01-21 ENCOUNTER — Ambulatory Visit (INDEPENDENT_AMBULATORY_CARE_PROVIDER_SITE_OTHER): Payer: Commercial Managed Care - PPO | Admitting: Physician Assistant

## 2011-01-21 VITALS — BP 120/74 | HR 94 | Resp 18 | Ht 66.0 in | Wt 135.1 lb

## 2011-01-21 DIAGNOSIS — R42 Dizziness and giddiness: Secondary | ICD-10-CM

## 2011-01-21 NOTE — Progress Notes (Signed)
History of Present Illness: PCP:  Dr. Jarome Matin Primary Cardiologist:  Dr. Valera Castle   [name] is a 30 y.o. female who has a h/o palpitations.  She was evaluated by Dr. Daleen Squibb in 5/11.  Echo 6/11: EF 65%, normal diastolic function.  She was to follow up prn.    Over the last several weeks, she has noted feelings of lightheadedness.  They generally occur while sitting and primarily after lunch at work.  She feels a discomfort in her chest with this.  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  She exercises when she can.  She denies exertional chest pain or dyspnea.  No orthopnea or PND.  No syncope.  She had one episode of near syncope at work.  States she saw something "gross" and felt flushed and feels like it was a vagal response.  Her BP was 92/62 at her Ob's office yesterday.  She had labs drawn: CMP, Hgb, TSH.  Of note, she has been told she is anemic and only takes Iron when she can remember it.    Past Medical History  Diagnosis Date  . Mastitis   . Pre-syncope   . Hypotension   . PCOS (polycystic ovarian syndrome)   . Torticollis     as a baby, surgically corrected    Medications No current outpatient prescriptions on file.    Allergies: Allergies  Allergen Reactions  . Bactrim     Fever, muscle ache.  . Penicillins Hives  . Vancomycin     RED MAN REACTION  . Augmentin Rash    History  Substance Use Topics  . Smoking status: Never Smoker   . Smokeless tobacco: Not on file  . Alcohol Use: No     ROS:  Please see the history of present illness.   Hematological and Lymphatic ROS: positive for - anemia.  Dermatological ROS: positive for nail changes.   All other systems reviewed and negative.   Vital Signs: BP 120/74  Pulse 94  Resp 18  Ht 5\' 6"  (1.676 m)  Wt 135 lb 1.9 oz (61.29 kg)  BMI 21.81 kg/m2  Filed Vitals:   01/21/11 0948 01/21/11 0951 01/21/11 0958 01/21/11 1001  BP: 116/77 113/72 112/74 120/74  Pulse: 77 82 93 94  Resp:       Height:      Weight:         PHYSICAL EXAM: Well nourished, well developed, in no acute distress HEENT: normal Neck: no JVD Endocrine: no thyromegaly Vascular: no carotid bruits Cardiac:  normal S1, S2; RRR; no murmur Lungs:  clear to auscultation bilaterally, no wheezing, rhonchi or rales Abd: soft, nontender, no hepatomegaly Ext: no edema Skin: warm and dry Neuro:  CNs 2-12 intact, no focal abnormalities noted Psych: normal affect  EKG:  NSR, HR 71, normal axis, no ischemic changes  ASSESSMENT AND PLAN:

## 2011-01-21 NOTE — Patient Instructions (Signed)
Your physician recommends that you schedule a follow-up appointment in: 6 WEEKS WITH DR. WALL PER SCOTT WEAVER, PA-C  Your physician has recommended that you wear an event monitor DX 780.4 DIZZINESS THIS NEEDS TO BE DONE BY SOMETIME NEXT WEEK. Event monitors are medical devices that record the heart's electrical activity. Doctors most often [place] these monitors to diagnose arrhythmias. Arrhythmias are problems with the speed or rhythm of the heartbeat. The monitor is a small, portable device. You can wear one while you do your normal daily activities. This is usually used to diagnose what is causing palpitations/syncope (passing out).  WE WILL GET YOUR LAB FROM DR. HAYWOOD AND DEPENDING ON THOSE LAB RESULTS WE WILL LET YOU KNOW IF YOU NEED TO KEEP THE APPT WITH DR. Daleen Squibb

## 2011-01-21 NOTE — Progress Notes (Signed)
Labs from Dr Mikey Bussing received.   Hgb 12.2, Na 140, K 4.6, creatinine 0.52, ALT 14, TSH 0.906, Vit D 36. Therefore patient will proceed with event monitor and follow up with Dr. Daleen Squibb. Tereso Newcomer, PA-C

## 2011-01-21 NOTE — Assessment & Plan Note (Signed)
Symptoms sound more c/w a metabolic issue than anything else.  I am somewhat suspicious of PAC/PVCs or some type of arrhythmia.  She had a normal echo last year.  I have suggested we obtain her labs drawn by Dr. Mikey Bussing.  I will arrange an event monitor and follow up with Dr. Daleen Squibb.  If her labs reveal a significant metabolic or hematologic issue, we can hold off on the monitor until these issues are addressed.  She is not significantly orthostatic.  Her HR does increase some.  I have suggested she continue to hydrate herself well.

## 2011-01-26 ENCOUNTER — Telehealth: Payer: Self-pay

## 2011-03-04 ENCOUNTER — Ambulatory Visit: Payer: Commercial Managed Care - PPO | Admitting: Cardiology

## 2011-06-02 NOTE — Telephone Encounter (Signed)
NCB

## 2011-10-07 ENCOUNTER — Telehealth: Payer: Self-pay | Admitting: Obstetrics and Gynecology

## 2011-10-07 NOTE — Telephone Encounter (Signed)
10/07/11-contd 12/05/2009.

## 2011-10-07 NOTE — Telephone Encounter (Signed)
[name]/[name] °

## 2011-10-07 NOTE — Telephone Encounter (Signed)
Tc to pt per telephone call. Lm on vm giving results of WBC=7.2 on

## 2012-01-17 ENCOUNTER — Ambulatory Visit (INDEPENDENT_AMBULATORY_CARE_PROVIDER_SITE_OTHER): Payer: Commercial Managed Care - PPO | Admitting: Obstetrics and Gynecology

## 2012-01-17 ENCOUNTER — Encounter: Payer: Self-pay | Admitting: Obstetrics and Gynecology

## 2012-01-17 VITALS — BP 100/66 | Ht 65.5 in | Wt 132.0 lb

## 2012-01-17 DIAGNOSIS — Z01419 Encounter for gynecological examination (general) (routine) without abnormal findings: Secondary | ICD-10-CM

## 2012-01-17 DIAGNOSIS — Z124 Encounter for screening for malignant neoplasm of cervix: Secondary | ICD-10-CM

## 2012-01-17 DIAGNOSIS — N915 Oligomenorrhea, unspecified: Secondary | ICD-10-CM | POA: Insufficient documentation

## 2012-01-17 DIAGNOSIS — N762 Acute vulvitis: Secondary | ICD-10-CM | POA: Insufficient documentation

## 2012-01-17 NOTE — Progress Notes (Signed)
ANNUAL:  Last Pap: 01/14/2010   WNL: Yes.  hpv not done Regular Periods:yes Contraception: BTL Monthly Breast exam:no Tetanus<29yrs:yes Nl.Bladder Function:yes Daily BMs:yes Healthy Diet:yes Calcium:no Mammogram:no Date of Mammogram: n/a Exercise:yes Have often Exercise: occasional Seatbelt: yes Abuse at home: no Stressful work:no Sigmoid-colonoscopy: n/a Bone Density: No PCP: Dr. Juluis Rainier Change in PMH: None Change in [facility]: None  Subjective:    [name] is a 31 y.o. female, G3P3, who presents for an annual exam.     History   Social History  . Marital Status: Married    Spouse Name: N/A    Number of Children: N/A  . Years of Education: N/A   Social History Main Topics  . Smoking status: Never Smoker   . Smokeless tobacco: Never Used  . Alcohol Use: No  . Drug Use: No  . Sexually Active: Yes    Birth Control/ Protection: Surgical     Comment: BTL   Other Topics Concern  . None   Social History Narrative  . None    Menstrual cycle:   LMP: No LMP recorded.           Cycle: monthly x5.  Some premen  The following portions of the patient's history were reviewed and updated as appropriate: allergies, current medications, past family history, past medical history, past social history, past surgical history and problem list.  Review of Systems Pertinent items are noted in HPI. Breast:Negative for breast lump,nipple discharge or nipple retraction Gastrointestinal: Negative for abdominal pain, change in bowel habits or rectal bleeding Urinary:negative   Objective:    There were no vitals taken for this visit.    Weight:  Wt Readings from Last 1 Encounters:  01/21/11 135 lb 1.9 oz (61.29 kg)          BMI: There is no height or weight on file to calculate BMI.  General Appearance: Alert, appropriate appearance for age. No acute distress HEENT: Grossly normal Neck / Thyroid: Supple, no masses, nodes or enlargement Lungs: clear to auscultation  bilaterally Back: No CVA tenderness Breast Exam: No masses or nodes.No dimpling, nipple retraction or discharge. Cardiovascular: Regular rate and rhythm. S1, S2, no murmur Gastrointestinal: Soft, non-tender, no masses or organomegaly Pelvic Exam: Vulva and vagina appear normal. Bimanual exam reveals normal uterus and adnexa. Rectovaginal: normal rectal, no masses Lymphatic Exam: Non-palpable nodes in neck, clavicular, axillary, or inguinal regions Skin: no rash or abnormalities Neurologic: Normal gait and speech, no tremor  Psychiatric: Alert and oriented, appropriate affect.   Wet Prep:not applicable Urinalysis:not applicable UPT: Not done   Assessment:    Normal gyn exam    Plan:    pap smear with HR HPV return annually or prn STD screening: declined Contraception:bilateral tubal ligation   Dierdre Forth MD

## 2012-01-20 LAB — PAP IG AND HPV HIGH-RISK: HPV DNA High Risk: NOT DETECTED

## 2012-01-24 ENCOUNTER — Encounter: Payer: Self-pay | Admitting: Obstetrics and Gynecology

## 2012-05-02 ENCOUNTER — Telehealth: Payer: Self-pay | Admitting: Certified Nurse Midwife

## 2012-05-02 ENCOUNTER — Encounter: Payer: Self-pay | Admitting: Certified Nurse Midwife

## 2012-05-02 ENCOUNTER — Ambulatory Visit: Payer: Commercial Managed Care - PPO | Admitting: Certified Nurse Midwife

## 2012-05-02 VITALS — BP 110/60 | Temp 98.5°F | Wt 131.0 lb

## 2012-05-02 DIAGNOSIS — B373 Candidiasis of vulva and vagina: Secondary | ICD-10-CM

## 2012-05-02 DIAGNOSIS — N898 Other specified noninflammatory disorders of vagina: Secondary | ICD-10-CM

## 2012-05-02 DIAGNOSIS — IMO0002 Reserved for concepts with insufficient information to code with codable children: Secondary | ICD-10-CM

## 2012-05-02 DIAGNOSIS — N390 Urinary tract infection, site not specified: Secondary | ICD-10-CM

## 2012-05-02 LAB — POCT WET PREP (WET MOUNT)
Bacteria Wet Prep HPF POC: NEGATIVE
Clue Cells Wet Prep Whiff POC: NEGATIVE

## 2012-05-02 MED ORDER — PHENAZOPYRIDINE HCL 200 MG PO TABS: 200.0000 mg | ORAL_TABLET | Freq: Three times a day (TID) | ORAL | Status: DC | PRN

## 2012-05-02 MED ORDER — TERCONAZOLE 80 MG VA SUPP: 80.0000 mg | Freq: Every day | VAGINAL | Status: DC

## 2012-05-02 MED ORDER — NITROFURANTOIN MONOHYD MACRO 100 MG PO CAPS: 100.0000 mg | ORAL_CAPSULE | Freq: Two times a day (BID) | ORAL | Status: AC

## 2012-05-02 MED ORDER — NYSTATIN-TRIAMCINOLONE 100000-0.1 UNIT/GM-% EX CREA: TOPICAL_CREAM | Freq: Two times a day (BID) | CUTANEOUS | Status: DC

## 2012-05-02 NOTE — Progress Notes (Deleted)
cma

## 2012-05-02 NOTE — Progress Notes (Signed)
C/O urinary frequency and urgency for one week No N&V or Diarrhea.  Labia irritation and white dc. Runner most days, but has  Tried many treatments to resolve issue Diflucan (left over) taken last week without relief Drinks 2 liters of H2O qd Rare caffeine intake O-No CVAT  Abd: no masses, pos suprapubic pressure  Pelvic:  D/C with odor noted.  Gray and greenish sample taken for wet prep  Bimanual:  External labia with out inflammation or lesions             Uterus sm and NT, Cx without CMT, Adnexa non tender and without masse  A-UTI     Vulvovaginitis     S/P BTL P-Terazol 3 vag supp     mycolog 2 cream to vulva     Vaginitis prevention     Sitz baths prn     Follow up prn and as scheduled     Macrobid Bid x 7-UC&S     UC & S, Pyridium 200 mgs up to 3 times a day.         Reviewed possible side effects of meds     Follow up prn or if no improvement

## 2012-05-22 ENCOUNTER — Ambulatory Visit: Payer: Commercial Managed Care - PPO | Admitting: Obstetrics and Gynecology

## 2012-05-22 ENCOUNTER — Encounter: Payer: Self-pay | Admitting: Obstetrics and Gynecology

## 2012-05-22 VITALS — BP 110/68 | HR 74 | Wt 128.0 lb

## 2012-05-22 DIAGNOSIS — N762 Acute vulvitis: Secondary | ICD-10-CM

## 2012-05-22 DIAGNOSIS — Z113 Encounter for screening for infections with a predominantly sexual mode of transmission: Secondary | ICD-10-CM

## 2012-05-22 LAB — POCT WET PREP (WET MOUNT)

## 2012-05-22 NOTE — Progress Notes (Signed)
32 YO requesting STD testing.  Patient has persistent vs recurrent vaginitis symptoms.   O:  Abdomen: soft, non-tender without masses       Pelvic: EGBUS-wnl, vagina-normal, large cloudy vaginal discharge cervix-no lesions and without tenderness, uterus-normal size and adnexae-no tenderness      or masses  Wet Prep:  ph-4.5, whiff-negative, no clue, trich or yeast OSOM Trich-negative   A: Request for STD Testing     Recurrrent vs Persistent Vulvitis     ? 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

## 2012-05-23 LAB — HEPATITIS B SURFACE ANTIGEN: Hepatitis B Surface Ag: NEGATIVE

## 2012-05-23 LAB — HEPATITIS C ANTIBODY: HCV Ab: NEGATIVE

## 2012-05-23 LAB — HSV 2 ANTIBODY, IGG: HSV 2 Glycoprotein G Ab, IgG: 0.4 IV

## 2012-05-23 LAB — HSV 1 ANTIBODY, IGG: HSV 1 Glycoprotein G Ab, IgG: 0.24 IV

## 2012-05-23 LAB — GC/CHLAMYDIA PROBE AMP: GC Probe RNA: NEGATIVE

## 2013-01-11 ENCOUNTER — Ambulatory Visit (INDEPENDENT_AMBULATORY_CARE_PROVIDER_SITE_OTHER): Payer: Commercial Managed Care - PPO | Admitting: Family Medicine

## 2013-01-11 VITALS — BP 118/68 | HR 99 | Temp 99.4°F | Resp 16 | Ht 66.75 in | Wt 133.6 lb

## 2013-01-11 DIAGNOSIS — J029 Acute pharyngitis, unspecified: Secondary | ICD-10-CM

## 2013-01-11 DIAGNOSIS — J019 Acute sinusitis, unspecified: Secondary | ICD-10-CM

## 2013-01-11 DIAGNOSIS — J3489 Other specified disorders of nose and nasal sinuses: Secondary | ICD-10-CM

## 2013-01-11 LAB — POCT RAPID STREP A (OFFICE): Rapid Strep A Screen: NEGATIVE

## 2013-01-11 MED ORDER — FLUTICASONE PROPIONATE 50 MCG/ACT NA SUSP: 2.0000 | Freq: Every day | NASAL | Status: DC

## 2013-01-11 MED ORDER — LEVOFLOXACIN 500 MG PO TABS: 500.0000 mg | ORAL_TABLET | Freq: Every day | ORAL | Status: DC

## 2013-01-11 NOTE — Patient Instructions (Signed)

## 2013-01-11 NOTE — Progress Notes (Signed)
[facility]:  Office Visit  Chief Complaint:   Chief Complaint  Patient presents with  . Fever    X lastnight  . Sinus Congestion    X lastnight  . Chest Pain    More at night, off and on X 2 weeks  . Sore Throat    X lastnight    HPI: [name] is a 32 y.o. female who is here here today with fever, sore throat, chest congestion, chest pressure, and fatigue over a week now. Was seen Sunday at urgent care somewhere else, but feels worse today. Was not given any medications but was told to continue with motrin.  Sore throat just started a day ago but her son was with strep a week ago. Was seen at urgent care and was told she had costochondritis and last night she felt worse underneath her right ribcage.  Took motrin for feverreduction and costochondritis and seems to help but without it she feels pain Tmax was 100.1. Sinus congestion, cough, fatigue  Past Medical History  Diagnosis Date  . Mastitis   . Pre-syncope   . Hypotension   . PCOS (polycystic ovarian syndrome)   . Torticollis     as a baby, surgically corrected  . Vulvitis   . Oligomenorrhea    Past Surgical History  Procedure Laterality Date  . Cesarean section    . Tubal ligation    . Tonsillectomy and adenoidectomy    . Neck surgery      RIGHT NECK SURGERY  . Wisdom tooth extraction    . Tympanostomy tube placement     History   Social History  . Marital Status: Married    Spouse Name: N/A    Number of Children: N/A  . Years of Education: N/A   Social History Main Topics  . Smoking status: Never Smoker   . Smokeless tobacco: Never Used  . Alcohol Use: No  . Drug Use: No  . Sexual Activity: Yes    Birth Control/ Protection: Surgical     Comment: BTL   Other Topics Concern  . None   Social History Narrative  . None   Family History  Problem Relation Age of Onset  . Heart murmur Neg Hx     UNCLE  . Hyperthyroidism Neg Hx     grandmother  . Hypertension Father   .  Emphysema Maternal Grandmother   . Emphysema Paternal Grandmother   . Cancer Paternal Grandmother    Allergies  Allergen Reactions  . Bactrim     Fever, muscle ache.  . Penicillins Hives  . Vancomycin     RED MAN REACTION  . Amoxicillin-Pot Clavulanate Rash   Prior to Admission medications   Medication Sig Start Date End Date Taking? Authorizing Provider  ibuprofen (ADVIL,MOTRIN) 600 MG tablet Take 600 mg by mouth every 6 (six) hours as needed for pain.   Yes Historical Provider, MD     ROS: The patient denies fevers, chills, night sweats, unintentional weight loss, chest pain, palpitations, wheezing, dyspnea on exertion, nausea, vomiting, abdominal pain, dysuria, hematuria, melena, numbness, weakness, or tingling.   All other systems have been reviewed and were otherwise negative with the exception of those mentioned in the HPI and as above.    PHYSICAL EXAM: Filed Vitals:   01/11/13 1046  BP: 118/68  Pulse: 99  Temp: 99.4 F (37.4 C)  Resp: 16   Filed Vitals:   01/11/13 1046  Height: 5' 6.75" (1.695 m)  Weight: 133 lb 9.6 oz (60.601 kg)   Body mass index is 21.09 kg/(m^2).  General: Alert, no acute distress just tired appearing HEENT:  Normocephalic, atraumatic, oropharynx patent. EOMI, PERRLA, + sinus tenderness, TM nl, erythematous throat, no exudates. + boggy nares Cardiovascular:  Regular rate and rhythm, no rubs murmurs or gallops.  No Carotid bruits, radial pulse intact. No pedal edema.  Respiratory: Clear to auscultation bilaterally.  No wheezes, rales, or rhonchi.  No cyanosis, no use of accessory musculature GI: No organomegaly, abdomen is soft and non-tender, positive bowel sounds.  No masses. Skin: No rashes. Neurologic: Facial musculature symmetric. Psychiatric: Patient is appropriate throughout our interaction. Lymphatic: No cervical lymphadenopathy Musculoskeletal: Gait intact.   LABS: Results for orders placed in visit on 01/11/13  POCT RAPID  STREP A (OFFICE)      Result Value Range   Rapid Strep A Screen Negative  Negative     EKG/XRAY:   Primary read interpreted by Dr. Conley Rolls at [facility].   ASSESSMENT/PLAN: Encounter Diagnoses  Name Primary?  . Acute sinusitis Yes  . Rhinorrhea   . Acute pharyngitis    Rx Levaquin Rx Flonase Salt water gargles LM on voicemail that strep was negative She states she usually needs a longer course of azithromycin for it to work so will elect to try levaquin F/u prn  Gross sideeffects, risk and benefits, and alternatives of medications d/w patient. Patient is aware that all medications have potential sideeffects and we are unable to predict every sideeffect or drug-drug interaction that may occur.  Hamilton Capri PHUONG, DO 01/11/2013 12:28 PM

## 2013-01-17 ENCOUNTER — Other Ambulatory Visit: Payer: Self-pay

## 2019-08-01 ENCOUNTER — Other Ambulatory Visit: Payer: Self-pay | Admitting: Obstetrics and Gynecology

## 2019-08-01 DIAGNOSIS — N6489 Other specified disorders of breast: Secondary | ICD-10-CM

## 2019-08-09 ENCOUNTER — Other Ambulatory Visit: Payer: Commercial Managed Care - PPO

## 2019-08-15 ENCOUNTER — Ambulatory Visit
Admission: RE | Admit: 2019-08-15 | Discharge: 2019-08-15 | Disposition: A | Discharge status: Home / Self Care | Payer: Commercial Managed Care - PPO | Source: Ambulatory Visit | Attending: Obstetrics and Gynecology | Admitting: Obstetrics and Gynecology

## 2019-08-15 ENCOUNTER — Other Ambulatory Visit: Payer: Self-pay | Admitting: Obstetrics and Gynecology

## 2019-08-15 ENCOUNTER — Ambulatory Visit
Admission: RE | Admit: 2019-08-15 | Discharge: 2019-08-15 | Disposition: A | Discharge status: Home / Self Care | Payer: PRIVATE HEALTH INSURANCE | Source: Ambulatory Visit | Attending: Obstetrics and Gynecology | Admitting: Obstetrics and Gynecology

## 2019-08-15 ENCOUNTER — Other Ambulatory Visit: Payer: Self-pay

## 2019-08-15 DIAGNOSIS — N6310 Unspecified lump in the right breast, unspecified quadrant: Secondary | ICD-10-CM

## 2019-08-15 DIAGNOSIS — N6489 Other specified disorders of breast: Secondary | ICD-10-CM

## 2019-08-15 DIAGNOSIS — N6320 Unspecified lump in the left breast, unspecified quadrant: Secondary | ICD-10-CM

## 2019-08-19 ENCOUNTER — Other Ambulatory Visit: Payer: Self-pay | Admitting: Obstetrics and Gynecology

## 2019-08-19 ENCOUNTER — Other Ambulatory Visit: Payer: Self-pay

## 2019-08-19 ENCOUNTER — Ambulatory Visit
Admission: RE | Admit: 2019-08-19 | Discharge: 2019-08-19 | Disposition: A | Discharge status: Home / Self Care | Payer: PRIVATE HEALTH INSURANCE | Source: Ambulatory Visit | Attending: Obstetrics and Gynecology | Admitting: Obstetrics and Gynecology

## 2019-08-19 DIAGNOSIS — N6320 Unspecified lump in the left breast, unspecified quadrant: Secondary | ICD-10-CM

## 2019-08-21 ENCOUNTER — Inpatient Hospital Stay: Admission: RE | Admit: 2019-08-21 | Payer: PRIVATE HEALTH INSURANCE | Source: Ambulatory Visit

## 2020-02-19 ENCOUNTER — Other Ambulatory Visit: Payer: Self-pay

## 2020-02-19 ENCOUNTER — Ambulatory Visit
Admission: RE | Admit: 2020-02-19 | Discharge: 2020-02-19 | Disposition: A | Discharge status: Home / Self Care | Payer: PRIVATE HEALTH INSURANCE | Source: Ambulatory Visit | Attending: Obstetrics and Gynecology | Admitting: Obstetrics and Gynecology

## 2020-02-19 ENCOUNTER — Other Ambulatory Visit: Payer: Self-pay | Admitting: Obstetrics and Gynecology

## 2020-02-19 DIAGNOSIS — N6310 Unspecified lump in the right breast, unspecified quadrant: Secondary | ICD-10-CM

## 2020-02-26 ENCOUNTER — Ambulatory Visit
Admission: RE | Admit: 2020-02-26 | Discharge: 2020-02-26 | Disposition: A | Discharge status: Home / Self Care | Payer: PRIVATE HEALTH INSURANCE | Source: Ambulatory Visit | Attending: Obstetrics and Gynecology | Admitting: Obstetrics and Gynecology

## 2020-02-26 ENCOUNTER — Other Ambulatory Visit: Payer: Self-pay

## 2020-02-26 DIAGNOSIS — N6310 Unspecified lump in the right breast, unspecified quadrant: Secondary | ICD-10-CM

## 2020-07-29 ENCOUNTER — Other Ambulatory Visit: Payer: Self-pay | Admitting: Obstetrics and Gynecology

## 2020-07-29 DIAGNOSIS — Z1231 Encounter for screening mammogram for malignant neoplasm of breast: Secondary | ICD-10-CM

## 2020-09-30 ENCOUNTER — Other Ambulatory Visit: Payer: Self-pay

## 2020-09-30 ENCOUNTER — Ambulatory Visit
Admission: RE | Admit: 2020-09-30 | Discharge: 2020-09-30 | Disposition: A | Discharge status: Home / Self Care | Payer: No Typology Code available for payment source | Source: Ambulatory Visit | Attending: Obstetrics and Gynecology | Admitting: Obstetrics and Gynecology

## 2020-09-30 DIAGNOSIS — Z1231 Encounter for screening mammogram for malignant neoplasm of breast: Secondary | ICD-10-CM

## 2021-03-06 ENCOUNTER — Other Ambulatory Visit: Payer: Self-pay

## 2021-03-06 ENCOUNTER — Emergency Department (HOSPITAL_BASED_OUTPATIENT_CLINIC_OR_DEPARTMENT_OTHER)
Admission: EM | Admit: 2021-03-06 | Discharge: 2021-03-06 | Disposition: A | Discharge status: Home / Self Care | Payer: PRIVATE HEALTH INSURANCE | Attending: Emergency Medicine | Admitting: Emergency Medicine

## 2021-03-06 ENCOUNTER — Encounter (HOSPITAL_BASED_OUTPATIENT_CLINIC_OR_DEPARTMENT_OTHER): Payer: Self-pay

## 2021-03-06 DIAGNOSIS — Z20822 Contact with and (suspected) exposure to covid-19: Secondary | ICD-10-CM | POA: Insufficient documentation

## 2021-03-06 DIAGNOSIS — R509 Fever, unspecified: Secondary | ICD-10-CM | POA: Diagnosis present

## 2021-03-06 DIAGNOSIS — Z8619 Personal history of other infectious and parasitic diseases: Secondary | ICD-10-CM | POA: Insufficient documentation

## 2021-03-06 LAB — CBC WITH DIFFERENTIAL/PLATELET
Abs Immature Granulocytes: 0 10*3/uL (ref 0.00–0.07)
Basophils Absolute: 0 10*3/uL (ref 0.0–0.1)
Basophils Relative: 1 %
Eosinophils Absolute: 0 10*3/uL (ref 0.0–0.5)
Eosinophils Relative: 1 %
HCT: 36.8 % (ref 36.0–46.0)
Hemoglobin: 12 g/dL (ref 12.0–15.0)
Immature Granulocytes: 0 %
Lymphocytes Relative: 19 %
Lymphs Abs: 0.4 10*3/uL — ABNORMAL LOW (ref 0.7–4.0)
MCH: 27.9 pg (ref 26.0–34.0)
MCHC: 32.6 g/dL (ref 30.0–36.0)
MCV: 85.6 fL (ref 80.0–100.0)
Monocytes Absolute: 0.2 10*3/uL (ref 0.1–1.0)
Monocytes Relative: 10 %
Neutro Abs: 1.5 10*3/uL — ABNORMAL LOW (ref 1.7–7.7)
Neutrophils Relative %: 69 %
Platelets: 151 10*3/uL (ref 150–400)
RBC: 4.3 MIL/uL (ref 3.87–5.11)
RDW: 14.3 % (ref 11.5–15.5)
WBC: 2.1 10*3/uL — ABNORMAL LOW (ref 4.0–10.5)
nRBC: 0 % (ref 0.0–0.2)

## 2021-03-06 LAB — RESP PANEL BY RT-PCR (FLU A&B, COVID) ARPGX2
Influenza A by PCR: NEGATIVE
Influenza B by PCR: NEGATIVE
SARS Coronavirus 2 by RT PCR: NEGATIVE

## 2021-03-06 LAB — COMPREHENSIVE METABOLIC PANEL
ALT: 85 U/L — ABNORMAL HIGH (ref 0–44)
AST: 58 U/L — ABNORMAL HIGH (ref 15–41)
Albumin: 4.2 g/dL (ref 3.5–5.0)
Alkaline Phosphatase: 69 U/L (ref 38–126)
Anion gap: 8 (ref 5–15)
BUN: 9 mg/dL (ref 6–20)
CO2: 26 mmol/L (ref 22–32)
Calcium: 8.8 mg/dL — ABNORMAL LOW (ref 8.9–10.3)
Chloride: 101 mmol/L (ref 98–111)
Creatinine, Ser: 0.6 mg/dL (ref 0.44–1.00)
GFR, Estimated: >60 mL/min (ref >60)
Glucose, Bld: 134 mg/dL — ABNORMAL HIGH (ref 70–99)
Potassium: 3.6 mmol/L (ref 3.5–5.1)
Sodium: 135 mmol/L (ref 135–145)
Total Bilirubin: 0.2 mg/dL — ABNORMAL LOW (ref 0.3–1.2)
Total Protein: 6.9 g/dL (ref 6.5–8.1)

## 2021-03-06 NOTE — Discharge Instructions (Addendum)
As we discussed your lab work overall has looked unremarkable. I think some of your symptoms can be attributed to your antibiotics. However, at this time you can continue your current antibiotic regimen, and I'd like you to call your doctor to discuss your long term treatment.   I'm sending you a referral to the rheumatologist to discuss lyme vs. Lupus treatment and the positive ANA and dsDNA labs you've had recently. If they don't give you a call within the next week, I've attached their contact information for you to call and make an appointment.

## 2021-03-06 NOTE — ED Notes (Signed)
Dc instructions reviewed with patient. Patient voiced understanding. Dc with belongings.  °

## 2021-03-06 NOTE — ED Triage Notes (Signed)
Pt states since Thanksgiving, she has been taking abx and herbal remedies for lyme's disease. Pt states 2 weeks ago, she developed a low grade fever and aches.  Pt stopped taking the abx because she read that can cause low grade fever. Pt states this morning she had another low grade fever and diarrhea  Ibuprofen at 1030.  Pt is concerned for infection- requesting basic labs. Pt appears anxious.

## 2021-03-06 NOTE — ED Provider Notes (Signed)
[facility] EMERGENCY DEPT Provider Note   CSN: [id number] Arrival date & time: 03/06/21  1124     History Chief Complaint  Patient presents with   Fever    [name] is a 40 y.o. female who presents the emergency department concern for persistent infection after being treated for Lyme disease.  Patient states that since late November she was taking antibiotics (Azithromycin, Cefoxitin, and Rifabutin) and herbal remedies for Lyme disease.  She states that 2 weeks ago she developed low-grade fever and body aches.  She had read that the antibiotics can cause a low-grade fever, so she stopped these.  She states that this morning she had another low-grade fever, and an episode of diarrhea.  She last had ibuprofen at 10:30 AM.  At this time she is concerned for infection, and is requesting basic labs.  She is also complaining of painful right big toe, no injury.   Fever Associated symptoms: diarrhea   Associated symptoms: no chest pain, no congestion, no cough, no dysuria, no nausea and no vomiting       Past Medical History:  Diagnosis Date   Hypotension    Mastitis    Oligomenorrhea    PCOS (polycystic ovarian syndrome)    Pre-syncope    Torticollis    as a baby, surgically corrected   Vulvitis     Patient Active Problem List   Diagnosis Date Noted   Vulvitis    Oligomenorrhea    MASTITIS 07/23/2009   DIZZINESS 07/23/2009   PALPITATIONS 07/23/2009   SHORTNESS OF BREATH 07/23/2009    Past Surgical History:  Procedure Laterality Date   CESAREAN SECTION     NECK SURGERY     RIGHT NECK SURGERY   TONSILLECTOMY AND ADENOIDECTOMY     TUBAL LIGATION     TYMPANOSTOMY TUBE PLACEMENT     WISDOM TOOTH EXTRACTION       OB History     Gravida  3   Para  3   Term      Preterm      AB      Living  3      SAB      IAB      Ectopic      Multiple      Live Births              Family History  Problem Relation Age of Onset    Hypertension Father    Emphysema Maternal Grandmother    Emphysema Paternal Grandmother    Cancer Paternal Grandmother    Heart murmur Neg Hx        UNCLE   Hyperthyroidism Neg Hx        grandmother    Social History   Tobacco Use   Smoking status: Never   Smokeless tobacco: Never  Substance Use Topics   Alcohol use: No   Drug use: No    Home Medications Prior to Admission medications   Medication Sig Start Date End Date Taking? Authorizing Provider  fluticasone (FLONASE) 50 MCG/ACT nasal spray Place 2 sprays into the nose daily. 01/11/13   Le, Thao P, DO  ibuprofen (ADVIL,MOTRIN) 600 MG tablet Take 600 mg by mouth every 6 (six) hours as needed for pain.    [provider]  levofloxacin (LEVAQUIN) 500 MG tablet Take 1 tablet (500 mg total) by mouth daily. 01/11/13   Le, Thao P, DO    Allergies    Bactrim, Penicillins, Vancomycin, and  Amoxicillin-pot clavulanate  Review of Systems   Review of Systems  Constitutional:  Positive for fatigue and fever.  HENT:  Negative for congestion.   Respiratory:  Negative for cough and shortness of breath.   Cardiovascular:  Negative for chest pain.  Gastrointestinal:  Positive for diarrhea. Negative for abdominal pain, constipation, nausea and vomiting.  Genitourinary:  Negative for dysuria.  Musculoskeletal:  Positive for arthralgias.  All other systems reviewed and are negative.  Physical Exam Updated Vital Signs BP 134/78 (BP Location: Right Arm)    Pulse 95    Temp 98.9 F (37.2 C) (Oral)    Resp 16    Ht 5\' 7"  (1.702 m)    Wt 90.7 kg    LMP 02/26/2021    SpO2 98%    BMI 31.32 kg/m   Physical Exam Vitals and nursing note reviewed.  Constitutional:      Appearance: Normal appearance.  HENT:     Head: Normocephalic and atraumatic.  Eyes:     Conjunctiva/sclera: Conjunctivae normal.  Cardiovascular:     Rate and Rhythm: Normal rate and regular rhythm.  Pulmonary:     Effort: Pulmonary effort is normal. No  respiratory distress.     Breath sounds: Normal breath sounds.  Abdominal:     General: There is no distension.     Palpations: Abdomen is soft.     Tenderness: There is no abdominal tenderness.  Musculoskeletal:     Right foot: Normal. No swelling, deformity or tenderness.     Left foot: Normal. No swelling, deformity or tenderness.  Skin:    General: Skin is warm and dry.  Neurological:     General: No focal deficit present.     Mental Status: She is alert.    ED Results / Procedures / Treatments   Labs (all labs ordered are listed, but only abnormal results are displayed) Labs Reviewed  CBC WITH DIFFERENTIAL/PLATELET - Abnormal; Notable for the following components:      Result Value   WBC 2.1 (*)    Neutro Abs 1.5 (*)    Lymphs Abs 0.4 (*)    All other components within normal limits  COMPREHENSIVE METABOLIC PANEL - Abnormal; Notable for the following components:   Glucose, Bld 134 (*)    Calcium 8.8 (*)    AST 58 (*)    ALT 85 (*)    Total Bilirubin 0.2 (*)    All other components within normal limits  RESP PANEL BY RT-PCR (FLU A&B, COVID) [id number]    EKG None  Radiology No results found.  Procedures Procedures   Medications Ordered in ED Medications - No data to display  ED Course  I have reviewed the triage vital signs and the nursing notes.  Pertinent labs & imaging results that were available during my care of the patient were reviewed by me and considered in my medical decision making (see chart for details).    MDM Rules/Calculators/A&P                          Patient is a 40 year old female who presents the emergency department with concern of infection after Lyme disease treatment.  She was recently prescribed azithromycin, rifabutin, and cefotetan by an integrative health doctor.  She is complaining of body aches, and a low-grade fever.  Highest recorded temperature at home 100 F.  My exam patient is afebrile, not tachycardic, not hypoxic, no  acute distress.  She is  also complaining of some right great toe pain and redness, but there is none present on my exam.  Overall lab work unremarkable, mild neutropenia to 2100, and mildly elevated LFTs.  In the setting of antibiotic treatment, especially the rifabutin, I expect that patient's symptoms as well as her laboratory abnormalities are related to these medications.  Overall she appears clinically well, and I have low suspicion for acute Lyme flare or [facility] spotted fever as patient states that symptoms have been present for over a year, with no recent changes.  Patient is not requiring admission or inpatient treatment for her symptoms at this time.  Discussed importance of contacting her PCP, to discuss long-term management of her symptoms.  Sound like she is had positive rheumatologic testing, will refer to rheumatology for further evaluation.  Discussed reasons to return to the emergency department, patient agreeable to plan.  I discussed this case with my attending physician Dr. Karene Fry who cosigned this note including patient's presenting symptoms, physical exam, and planned diagnostics and interventions. Attending physician stated agreement with plan or made changes to plan which were implemented.    Final Clinical Impression(s) / ED Diagnoses Final diagnoses:  History of Lyme disease  Fever, unspecified fever cause    Rx / DC Orders ED Discharge Orders          Ordered    Ambulatory referral to Rheumatology        03/06/21 1345           Portions of this report may have been transcribed using voice recognition software. Every effort was made to ensure accuracy; however, inadvertent computerized transcription errors may be present.    Jeanella Flattery 03/06/21 1613    Ernie Avena, MD 03/06/21 1650

## 2021-05-17 ENCOUNTER — Ambulatory Visit: Payer: PRIVATE HEALTH INSURANCE | Admitting: Rheumatology

## 2021-06-16 ENCOUNTER — Ambulatory Visit: Payer: PRIVATE HEALTH INSURANCE | Admitting: Rheumatology

## 2021-06-17 NOTE — Progress Notes (Signed)
? ?Office Visit Note ? ?Patient: [name]             ?Date of Birth: 1980-12-11           ?MRN: [id number]             ?PCP: Pcp, No ?Referring: No ref. provider found ?Visit Date: 06/30/2021 ?Occupation: '@GUAROCC' @ ? ?Subjective:  ?Fatigue, joint pain and abnormal labs ? ?History of Present Illness: [name] is a 41 y.o. female seen in consultation after her emergency room visit.  According to the patient in 2016 she went through a very difficult divorce and was under a lot of stress.  She was experiencing increased fatigue.  She states that her ex-husband developed EBV and Burkitt's lymphoma.  She states she went to [place] after seeing several physicians and was diagnosed with EBV and mycoplasma.  She was given antibiotics for EBV and mycoplasma.  Later she was diagnosed with Lyme disease and had 2 courses for Lyme disease.  She at the same time started having some discomfort in her joints which she describes in her right knee joint and gradually moved into her right toe.  She was experiencing pain and stiffness all over her joints.  She states that she was diagnosed with Bartonella and babesiosis at [place].  She was given course of multiple antibiotics towards the end of 2022.  She states she felt very sick while she was on that regimen and was seen in the emergency room.  At that time she had labs done at the emergency room.  She was found to have elevated LFTs.  She states after the antibiotic course her generalized achiness improved to some extent.  In 2020 she started having pain and discomfort in her right first MTP joint which was also swollen she was seen at [facility] where she was told that she had osteoarthritis.  She states she continues to have pain and discomfort in her right first MTP joint and sometimes in her right foot.  The discomfort in her hands has improved.  She had labs at [place] in July 2022 which showed ANA and double-stranded DNA.  She states she has had chest  pain and palpitations off and on for many years although the symptoms have been worse for the last 2 months.  She was placed on Synthroid by [facility] for fatigue.  She states her thyroid functions were normal.  There is no family history of autoimmune disease except her sister was diagnosed with lichen Pilo pilaris and has been under treatment by dermatologist in [place].  There is questionable history of maternal grand and having lupus.  She is gravida 3, para 3, miscarriages 0.  There is no history of DVTs.  Birth control method is tubal ligation. ? ?Activities of Daily Living:  ?Patient reports morning stiffness for 30 minutes.   ?Patient Denies nocturnal pain.  ?Difficulty dressing/grooming: Denies ?Difficulty climbing stairs: Denies ?Difficulty getting out of chair: Denies ?Difficulty using hands for taps, buttons, cutlery, and/or writing: Denies ? ?Review of Systems  ?Constitutional:  Positive for fatigue.  ?HENT:  Negative for mouth sores, mouth dryness and nose dryness.   ?Eyes:  Negative for pain, itching and dryness.  ?Respiratory:  Positive for shortness of breath. Negative for difficulty breathing.   ?Cardiovascular:  Positive for chest pain and palpitations.  ?Gastrointestinal:  Negative for blood in stool, constipation and diarrhea.  ?Endocrine: Negative for increased urination.  ?Genitourinary:  Negative for difficulty urinating.  ?Musculoskeletal:  Positive  for joint pain, joint pain, joint swelling and morning stiffness. Negative for myalgias, muscle tenderness and myalgias.  ?Skin:  Negative for color change, rash, redness and sensitivity to sunlight.  ?Allergic/Immunologic: Negative for susceptible to infections.  ?Neurological:  Positive for dizziness. Negative for numbness, headaches, memory loss and weakness.  ?Hematological:  Negative for bruising/bleeding tendency and swollen glands.  ?Psychiatric/Behavioral:  Negative for depressed mood, confusion and sleep disturbance. The patient is  nervous/anxious.   ? ?PMFS History:  ?Patient Active Problem List  ? Diagnosis Date Noted  ? Vulvitis   ? Oligomenorrhea   ? MASTITIS 07/23/2009  ? DIZZINESS 07/23/2009  ? PALPITATIONS 07/23/2009  ? SHORTNESS OF BREATH 07/23/2009  ?  ?Past Medical History:  ?Diagnosis Date  ? [place] Patient infection   ? Hypotension   ? Lyme disease   ? per patient  ? Mastitis   ? Oligomenorrhea   ? PCOS (polycystic ovarian syndrome)   ? Pre-syncope   ? Torticollis   ? as a baby, surgically corrected  ? Vulvitis   ?  ?Family History  ?Problem Relation Age of Onset  ? Hypertension Mother   ? Diabetes Father   ? Hypertension Father   ? Autoimmune disease Sister   ? Emphysema Maternal Grandmother   ? Emphysema Paternal Grandmother   ? Cancer Paternal Grandmother   ? Healthy Son   ? Healthy Son   ? Healthy Son   ? Heart murmur Neg Hx   ?     UNCLE  ? Hyperthyroidism Neg Hx   ?     grandmother  ? ?Past Surgical History:  ?Procedure Laterality Date  ? CESAREAN SECTION    ? NECK SURGERY    ? RIGHT NECK SURGERY  ? TONSILLECTOMY AND ADENOIDECTOMY    ? TUBAL LIGATION    ? TYMPANOSTOMY TUBE PLACEMENT    ? WISDOM TOOTH EXTRACTION    ? ?Social History  ? ?Social History Narrative  ? Not on file  ? ?Immunization History  ?Administered Date(s) Administered  ? PFIZER(Purple Top)SARS-COV-2 Vaccination 10/16/2019, 11/06/2019  ?  ? ?Objective: ?Vital Signs: BP 128/83 (BP Location: Right Arm, Patient Position: Sitting, Cuff Size: Normal)   Pulse 89   Ht '5\' 6"'  (1.676 m)   Wt 199 lb (90.3 kg)   BMI 32.12 kg/m?   ? ?Physical Exam ?Vitals and nursing note reviewed.  ?Constitutional:   ?   Appearance: She is well-developed.  ?HENT:  ?   Head: Normocephalic and atraumatic.  ?Eyes:  ?   Conjunctiva/sclera: Conjunctivae normal.  ?Cardiovascular:  ?   Rate and Rhythm: Normal rate and regular rhythm.  ?   Heart sounds: Normal heart sounds.  ?Pulmonary:  ?   Effort: Pulmonary effort is normal.  ?   Breath sounds: Normal breath sounds.  ?Abdominal:  ?   General:  Bowel sounds are normal.  ?   Palpations: Abdomen is soft.  ?Musculoskeletal:  ?   Cervical back: Normal range of motion.  ?Lymphadenopathy:  ?   Cervical: No cervical adenopathy.  ?Skin: ?   General: Skin is warm and dry.  ?   Capillary Refill: Capillary refill takes less than 2 seconds.  ?Neurological:  ?   Mental Status: She is alert and oriented to person, place, and time.  ?Psychiatric:     ?   Behavior: Behavior normal.  ?  ? ?Musculoskeletal Exam: C-spine thoracic and lumbar spine were in good range of motion.  No SI joint tenderness was noted.  Shoulder joints, elbow joints, wrist joints, MCPs PIPs and DIPs with good range of motion.  She had bilateral first PIP, bilateral first DIP and fourth DIP prominence in her hands with no synovitis.  Hip joints and knee joints with good range of motion.  No warmth swelling or effusion was noted.  She had right foot hallux rigidus.  No synovitis was noted.  There was no evidence of Planter fasciitis or Achilles tendinitis. ? ?CDAI Exam: ?CDAI Score: -- ?Patient Global: --; Provider Global: -- ?Swollen: --; Tender: -- ?Joint Exam 06/30/2021  ? ?No joint exam has been documented for this visit  ? ?There is currently no information documented on the homunculus. Go to the Rheumatology activity and complete the homunculus joint exam. ? ?Investigation: ?No additional findings. ? ?Imaging: ?No results found. ? ?Recent Labs: ?Lab Results  ?Component Value Date  ? WBC 2.1 (L) 03/06/2021  ? HGB 12.0 03/06/2021  ? PLT 151 03/06/2021  ? NA 135 03/06/2021  ? K 3.6 03/06/2021  ? CL 101 03/06/2021  ? CO2 26 03/06/2021  ? GLUCOSE 134 (H) 03/06/2021  ? BUN 9 03/06/2021  ? CREATININE 0.60 03/06/2021  ? BILITOT 0.2 (L) 03/06/2021  ? ALKPHOS 69 03/06/2021  ? AST 58 (H) 03/06/2021  ? ALT 85 (H) 03/06/2021  ? PROT 6.9 03/06/2021  ? ALBUMIN 4.2 03/06/2021  ? CALCIUM 8.8 (L) 03/06/2021  ? GFRAA  09/19/2008  ?  >60        ?The eGFR has been calculated ?using the MDRD equation. ?This  calculation has not been ?validated in all clinical ?situations. ?eGFR's persistently ?<60 mL/min signify ?possible Chronic Kidney Disease.  ? ? ?Speciality Comments: No specialty comments available. ? ?Procedures:  ?

## 2021-06-30 ENCOUNTER — Ambulatory Visit (INDEPENDENT_AMBULATORY_CARE_PROVIDER_SITE_OTHER): Payer: PRIVATE HEALTH INSURANCE

## 2021-06-30 ENCOUNTER — Ambulatory Visit (INDEPENDENT_AMBULATORY_CARE_PROVIDER_SITE_OTHER): Payer: PRIVATE HEALTH INSURANCE | Admitting: Rheumatology

## 2021-06-30 ENCOUNTER — Encounter: Payer: Self-pay | Admitting: Rheumatology

## 2021-06-30 VITALS — BP 128/83 | HR 89 | Ht 66.0 in | Wt 199.0 lb

## 2021-06-30 DIAGNOSIS — Z8639 Personal history of other endocrine, nutritional and metabolic disease: Secondary | ICD-10-CM

## 2021-06-30 DIAGNOSIS — M79642 Pain in left hand: Secondary | ICD-10-CM | POA: Diagnosis not present

## 2021-06-30 DIAGNOSIS — M79641 Pain in right hand: Secondary | ICD-10-CM

## 2021-06-30 DIAGNOSIS — M79672 Pain in left foot: Secondary | ICD-10-CM | POA: Diagnosis not present

## 2021-06-30 DIAGNOSIS — M79671 Pain in right foot: Secondary | ICD-10-CM

## 2021-06-30 DIAGNOSIS — M2550 Pain in unspecified joint: Secondary | ICD-10-CM

## 2021-06-30 DIAGNOSIS — R5383 Other fatigue: Secondary | ICD-10-CM | POA: Diagnosis not present

## 2021-06-30 DIAGNOSIS — D708 Other neutropenia: Secondary | ICD-10-CM

## 2021-06-30 DIAGNOSIS — R002 Palpitations: Secondary | ICD-10-CM

## 2021-06-30 DIAGNOSIS — R0602 Shortness of breath: Secondary | ICD-10-CM

## 2021-06-30 DIAGNOSIS — R768 Other specified abnormal immunological findings in serum: Secondary | ICD-10-CM | POA: Diagnosis not present

## 2021-06-30 DIAGNOSIS — R7989 Other specified abnormal findings of blood chemistry: Secondary | ICD-10-CM

## 2021-06-30 DIAGNOSIS — E282 Polycystic ovarian syndrome: Secondary | ICD-10-CM

## 2021-06-30 DIAGNOSIS — B999 Unspecified infectious disease: Secondary | ICD-10-CM

## 2021-06-30 NOTE — Patient Instructions (Signed)
Hand Exercises Hand exercises can be helpful for almost anyone. These exercises can strengthen the hands, improve flexibility and movement, and increase blood flow to the hands. These results can make work and daily tasks easier. Hand exercises can be especially helpful for people who have joint pain from arthritis or have nerve damage from overuse (carpal tunnel syndrome). These exercises can also help people who have injured a hand. Exercises Most of these hand exercises are gentle stretching and motion exercises. It is usually safe to do them often throughout the day. Warming up your hands before exercise may help to reduce stiffness. You can do this with gentle massage or by placing your hands in warm water for 10-15 minutes. It is normal to feel some stretching, pulling, tightness, or mild discomfort as you begin new exercises. This will gradually improve. Stop an exercise right away if you feel sudden, severe pain or your pain gets worse. Ask your health care provider which exercises are best for you. Knuckle bend or "claw" fist  Stand or sit with your arm, hand, and all five fingers pointed straight up. Make sure to keep your wrist straight during the exercise. Gently bend your fingers down toward your palm until the tips of your fingers are touching the top of your palm. Keep your big knuckle straight and just bend the small knuckles in your fingers. Hold this position for __________ seconds. Straighten (extend) your fingers back to the starting position. Repeat this exercise 5-10 times with each hand. Full finger fist  Stand or sit with your arm, hand, and all five fingers pointed straight up. Make sure to keep your wrist straight during the exercise. Gently bend your fingers into your palm until the tips of your fingers are touching the middle of your palm. Hold this position for __________ seconds. Extend your fingers back to the starting position, stretching every joint fully. Repeat  this exercise 5-10 times with each hand. Straight fist Stand or sit with your arm, hand, and all five fingers pointed straight up. Make sure to keep your wrist straight during the exercise. Gently bend your fingers at the big knuckle, where your fingers meet your hand, and the middle knuckle. Keep the knuckle at the tips of your fingers straight and try to touch the bottom of your palm. Hold this position for __________ seconds. Extend your fingers back to the starting position, stretching every joint fully. Repeat this exercise 5-10 times with each hand. Tabletop  Stand or sit with your arm, hand, and all five fingers pointed straight up. Make sure to keep your wrist straight during the exercise. Gently bend your fingers at the big knuckle, where your fingers meet your hand, as far down as you can while keeping the small knuckles in your fingers straight. Think of forming a tabletop with your fingers. Hold this position for __________ seconds. Extend your fingers back to the starting position, stretching every joint fully. Repeat this exercise 5-10 times with each hand. Finger spread  Place your hand flat on a table with your palm facing down. Make sure your wrist stays straight as you do this exercise. Spread your fingers and thumb apart from each other as far as you can until you feel a gentle stretch. Hold this position for __________ seconds. Bring your fingers and thumb tight together again. Hold this position for __________ seconds. Repeat this exercise 5-10 times with each hand. Making circles  Stand or sit with your arm, hand, and all five fingers pointed   straight up. Make sure to keep your wrist straight during the exercise. Make a circle by touching the tip of your thumb to the tip of your index finger. Hold for __________ seconds. Then open your hand wide. Repeat this motion with your thumb and each finger on your hand. Repeat this exercise 5-10 times with each hand. Thumb  motion  Sit with your forearm resting on a table and your wrist straight. Your thumb should be facing up toward the ceiling. Keep your fingers relaxed as you move your thumb. Lift your thumb up as high as you can toward the ceiling. Hold for __________ seconds. Bend your thumb across your palm as far as you can, reaching the tip of your thumb for the small finger (pinkie) side of your palm. Hold for __________ seconds. Repeat this exercise 5-10 times with each hand. Grip strengthening  Hold a stress ball or other soft ball in the middle of your hand. Slowly increase the pressure, squeezing the ball as much as you can without causing pain. Think of bringing the tips of your fingers into the middle of your palm. All of your finger joints should bend when doing this exercise. Hold your squeeze for __________ seconds, then relax. Repeat this exercise 5-10 times with each hand. Contact a health care provider if: Your hand pain or discomfort gets much worse when you do an exercise. Your hand pain or discomfort does not improve within 2 hours after you exercise. If you have any of these problems, stop doing these exercises right away. Do not do them again unless your health care provider says that you can. Get help right away if: You develop sudden, severe hand pain or swelling. If this happens, stop doing these exercises right away. Do not do them again unless your health care provider says that you can. This information is not intended to replace advice given to you by your health care provider. Make sure you discuss any questions you have with your health care provider. Document Revised: 06/18/2020 Document Reviewed: 06/18/2020 Elsevier Patient Education  2023 Elsevier Inc.  

## 2021-07-02 NOTE — Progress Notes (Signed)
I will discuss results at the follow-up visit.

## 2021-07-06 ENCOUNTER — Ambulatory Visit: Payer: PRIVATE HEALTH INSURANCE | Admitting: Internal Medicine

## 2021-07-06 ENCOUNTER — Encounter: Payer: Self-pay | Admitting: Internal Medicine

## 2021-07-06 ENCOUNTER — Ambulatory Visit (INDEPENDENT_AMBULATORY_CARE_PROVIDER_SITE_OTHER): Payer: PRIVATE HEALTH INSURANCE

## 2021-07-06 VITALS — BP 120/78 | HR 73 | Ht 66.0 in | Wt 198.8 lb

## 2021-07-06 DIAGNOSIS — Z9289 Personal history of other medical treatment: Secondary | ICD-10-CM

## 2021-07-06 DIAGNOSIS — R0602 Shortness of breath: Secondary | ICD-10-CM | POA: Diagnosis not present

## 2021-07-06 DIAGNOSIS — R768 Other specified abnormal immunological findings in serum: Secondary | ICD-10-CM

## 2021-07-06 DIAGNOSIS — R002 Palpitations: Secondary | ICD-10-CM

## 2021-07-06 LAB — LUPUS ANTICOAGULANT EVAL W/ REFLEX
PTT-LA Screen: 34 s (ref <=40)
dRVVT: 39 s (ref <=45)

## 2021-07-06 LAB — COMPLETE METABOLIC PANEL WITH GFR
AG Ratio: 1.7 (calc) (ref 1.0–2.5)
ALT: 20 U/L (ref 6–29)
AST: 18 U/L (ref 10–30)
Albumin: 4.6 g/dL (ref 3.6–5.1)
Alkaline phosphatase (APISO): 76 U/L (ref 31–125)
BUN: 11 mg/dL (ref 7–25)
CO2: 27 mmol/L (ref 20–32)
Calcium: 9.5 mg/dL (ref 8.6–10.2)
Chloride: 104 mmol/L (ref 98–110)
Creat: 0.58 mg/dL (ref 0.50–0.99)
Globulin: 2.7 g/dL (calc) (ref 1.9–3.7)
Glucose, Bld: 65 mg/dL (ref 65–99)
Potassium: 4 mmol/L (ref 3.5–5.3)
Sodium: 141 mmol/L (ref 135–146)
Total Bilirubin: 0.2 mg/dL (ref 0.2–1.2)
Total Protein: 7.3 g/dL (ref 6.1–8.1)
eGFR: 116 mL/min/{1.73_m2} (ref >=60)

## 2021-07-06 LAB — PROTEIN ELECTROPHORESIS, SERUM, WITH REFLEX
Albumin ELP: 4.1 g/dL (ref 3.8–4.8)
Alpha 1: 0.3 g/dL (ref 0.2–0.3)
Alpha 2: 0.8 g/dL (ref 0.5–0.9)
Beta 2: 0.4 g/dL (ref 0.2–0.5)
Beta Globulin: 0.5 g/dL (ref 0.4–0.6)
Gamma Globulin: 1.1 g/dL (ref 0.8–1.7)
Total Protein: 7.2 g/dL (ref 6.1–8.1)

## 2021-07-06 LAB — CBC WITH DIFFERENTIAL/PLATELET
Absolute Monocytes: 466 cells/uL (ref 200–950)
Basophils Absolute: 21 cells/uL (ref 0–200)
Basophils Relative: 0.6 %
Eosinophils Absolute: 81 cells/uL (ref 15–500)
Eosinophils Relative: 2.3 %
HCT: 38.6 % (ref 35.0–45.0)
Hemoglobin: 12.3 g/dL (ref 11.7–15.5)
Lymphs Abs: 1208 cells/uL (ref 850–3900)
MCH: 28.3 pg (ref 27.0–33.0)
MCHC: 31.9 g/dL — ABNORMAL LOW (ref 32.0–36.0)
MCV: 88.7 fL (ref 80.0–100.0)
MPV: 11.5 fL (ref 7.5–12.5)
Monocytes Relative: 13.3 %
Neutro Abs: 1726 cells/uL (ref 1500–7800)
Neutrophils Relative %: 49.3 %
Platelets: 277 10*3/uL (ref 140–400)
RBC: 4.35 10*6/uL (ref 3.80–5.10)
RDW: 14.5 % (ref 11.0–15.0)
Total Lymphocyte: 34.5 %
WBC: 3.5 10*3/uL — ABNORMAL LOW (ref 3.8–10.8)

## 2021-07-06 LAB — URINALYSIS, ROUTINE W REFLEX MICROSCOPIC
Bilirubin Urine: NEGATIVE
Glucose, UA: NEGATIVE
Hgb urine dipstick: NEGATIVE
Ketones, ur: NEGATIVE
Leukocytes,Ua: NEGATIVE
Nitrite: NEGATIVE
Protein, ur: NEGATIVE
Specific Gravity, Urine: 1.016 (ref 1.001–1.035)
pH: 6.5 (ref 5.0–8.0)

## 2021-07-06 LAB — IGG, IGA, IGM
IgG (Immunoglobin G), Serum: 1230 mg/dL (ref 600–1640)
IgM, Serum: 109 mg/dL (ref 50–300)
Immunoglobulin A: 213 mg/dL (ref 47–310)

## 2021-07-06 LAB — SEDIMENTATION RATE: Sed Rate: 9 mm/h (ref 0–20)

## 2021-07-06 LAB — CK: Total CK: 77 U/L (ref 29–143)

## 2021-07-06 LAB — TSH: TSH: 0.85 mIU/L

## 2021-07-06 NOTE — Progress Notes (Signed)
?Cardiology Office Note:   ? ?Date:  07/06/2021  ? ?ID:  [name], DOB [birth year], MRN [id number] ? ?PCP:  Pcp, No  ?Cardiologist:  None  ?Electrophysiologist:  None  ? ?Referring MD: Pollyann Savoy, MD  ? ?Chief Complaint/Reason for Referral: ?palpitations ? ?History of Present Illness:   ? ?[name] is a 41 y.o. female with a history of EBV, prior Lyme disease with no cardiovascular manifestations, and PCOS who presents with palpitations.  ? ?Palpitations for a while but worsening now.  Can happen while lying down.  ?In 2 mo occured 3 times - seconds in duration. Occasional dizziness. No identifiable triggers. Was given Armour thyroid by an integrative medicine practitioner though thyroid labs reportedly normal.  ?One episode a few years ago of low pulse, "felt weird", pulse ox noted HR 45 and BP cuff noted arrhythmia.  ?Vagal episodes in history with blood draws, has had syncope from this.  ? ?No family history of palpitations or sudden cardiac death. ? ?Caffeine: 2 cup coffee ?Alcohol: none ?Water intake: tries to drink 2 L a day ?Snoring:no ?TSH: 0.85 ?Herbal supplements/diet products: no ?Syncope/presyncope: a few episodes with drawing blood or pain with twisting ankle.  ? 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 ? ? ?Past Medical History:  ?Diagnosis Date  ? [place] Carl infection   ? Hypotension   ? Lyme disease   ? per patient  ? Mastitis   ? Oligomenorrhea   ? PCOS (polycystic ovarian syndrome)   ? Pre-syncope   ? Torticollis   ? as a baby, surgically corrected  ? Vulvitis   ? ? ?Past Surgical History:  ?Procedure Laterality Date  ? CESAREAN SECTION    ? NECK SURGERY    ? RIGHT NECK SURGERY  ? TONSILLECTOMY AND ADENOIDECTOMY    ? TUBAL LIGATION    ? TYMPANOSTOMY TUBE PLACEMENT    ? WISDOM TOOTH EXTRACTION    ? ? ?Current Medications: ?Current Meds  ?Medication Sig  ? ibuprofen (ADVIL,MOTRIN) 600 MG tablet Take 600 mg by mouth every 6 (six) hours as needed for pain.  ?  thyroid (ARMOUR) 30 MG tablet NP Thyroid 30 mg tablet ? Take 1 tablet every day by oral route.  ?  ? ?Allergies:   Bactrim, Vancomycin, and Amoxicillin-pot clavulanate  ? ?Social History  ? ?Tobacco Use  ? Smoking status: Never  ?  Passive exposure: Never  ? Smokeless tobacco: Never  ?Vaping Use  ? Vaping Use: Never used  ?Substance Use Topics  ? Alcohol use: No  ? Drug use: No  ?  ? ?Family History: ?The patient's family history includes Autoimmune disease in her sister; Cancer in her paternal grandmother; Diabetes in her father; Emphysema in her maternal grandmother and paternal grandmother; Healthy in her son, son, and son; Hypertension in her father and mother. There is no history of Heart murmur or Hyperthyroidism. ? ?ROS:   ?Please see the history of present illness.    ?All other systems reviewed and are negative. ? ?EKGs/Labs/Other Studies Reviewed:   ? ?The following studies were reviewed today: ? ?EKG:  NSR ? ?Imaging studies that I have independently reviewed today: n/a ? ?Recent Labs: ?06/30/2021: ALT 20; BUN 11; Creat 0.58; Hemoglobin 12.3; Platelets 277; Potassium 4.0; Sodium 141; TSH 0.85  ?Recent Lipid Panel ?No results found for: CHOL, TRIG, HDL, CHOLHDL, VLDL, LDLCALC, LDLDIRECT ? ?Physical Exam:   ? ?VS:  BP 120/78   Pulse 73   Ht  5\' 6"  (1.676 m)   Wt 198 lb 12.8 oz (90.2 kg)   SpO2 100%   BMI 32.09 kg/m?    ? ?Wt Readings from Last 5 Encounters:  ?07/06/21 198 lb 12.8 oz (90.2 kg)  ?06/30/21 199 lb (90.3 kg)  ?03/06/21 200 lb (90.7 kg)  ?01/11/13 133 lb 9.6 oz (60.6 kg)  ?05/22/12 128 lb (58.1 kg)  ?  ?Constitutional: No acute distress ?Eyes: sclera non-icteric, normal conjunctiva and lids ?ENMT: normal dentition, moist mucous membranes ?Cardiovascular: regular rhythm, normal rate, no murmur. S1 and S2 normal. No jugular venous distention.  ?Respiratory: clear to auscultation bilaterally ?GI : normal bowel sounds, soft and nontender. No distention.   ?MSK: extremities warm, well perfused. No  edema.  ?NEURO: grossly nonfocal exam, moves all extremities. ?PSYCH: alert and oriented x 3, normal mood and affect.  ? ?ASSESSMENT:   ? ?1. Palpitations   ?2. Shortness of breath   ?3. Positive ANA (antinuclear antibody)   ?4. History of positive double stranded DNA antibody test   ? ?PLAN:   ? ?Palpitations - Plan: EKG 12-Lead, ECHOCARDIOGRAM COMPLETE, LONG TERM MONITOR (3-14 DAYS) ? ?Shortness of breath ? ?Positive ANA (antinuclear antibody) ? ?History of positive double stranded DNA antibody test ? ?- She has an undefined etiology for positive ANA and DSDNA currently being worked up by rheumatology who initiated this referral. She is being seen for palpitations. They are infrequent, however patient is concerned and would like to define nature of palpitations. She is on thyroid supplementation. Will obtain Zio for 2 weeks and have asked her to correlate symptoms.  ?- recommend echocardiogram to evaluate SOB that can accompany palpitations. If rheum workup is revealing for lupus or CTD, may help to have baseline echocardiogram and baseline pulmonary pressures. Patient agrees.  ? ?Return to clinic: prn testing results.  ? ?Total time of encounter: ?50 minutes total time of encounter, including 30 minutes spent in face-to-face patient care on the date of this encounter. This time includes coordination of care and counseling regarding above mentioned problem list. Remainder of non-face-to-face time involved reviewing chart documents/testing relevant to the patient encounter and documentation in the medical record. I have independently reviewed documentation from referring provider.  ? ?Weston BrassGayatri Saad Buhl, MD, [facility] CenterFACC ?[place]  [facility]  ? ?Shared Decision Making/Informed Consent:   ?   ? ?Medication Adjustments/Labs and Tests Ordered: ?Current medicines are reviewed at length with the patient today.  Concerns regarding medicines are outlined above.  ? ?Orders Placed This Encounter  ?Procedures  ? LONG TERM  MONITOR (3-14 DAYS)  ? EKG 12-Lead  ? ECHOCARDIOGRAM COMPLETE  ? ? ?No orders of the defined types were placed in this encounter. ? ? ?Patient Instructions  ?Medication Instructions:  ?No Changes In Medications at this time.  ?*If you need a refill on your cardiac medications before your next appointment, please call your pharmacy* ? ?Testing/Procedures: ?Your physician has requested that you have an echocardiogram. Echocardiography is a painless test that uses sound waves to create images of your heart. It provides your doctor with information about the size and shape of your heart and how well your heart?s chambers and valves are working. You may receive an ultrasound enhancing agent through an IV if needed to better visualize your heart during the echo.This procedure takes approximately one hour. There are no restrictions for this procedure. [address]  ? ? ?ZIO XT- Long Term Monitor Instructions  ? ?  Your physician has requested you wear your ZIO patch monitor___14____days.  ? ?This is a single patch monitor.  [employer] supplies one patch monitor per enrollment.  Additional stickers are not available. ?  ?Please do not apply patch if you will be having a Nuclear Stress Test, Echocardiogram, Cardiac CT, MRI, or Chest Xray during the time frame you would be wearing the monitor. The patch cannot be worn during these tests.  You cannot remove and re-apply the ZIO XT patch monitor. ?  ?Your ZIO patch monitor will be sent USPS Priority mail from [facility] directly to your home address. The monitor may also be mailed to a PO BOX if home delivery is not available.   It may take 3-5 days to receive your monitor after you have been enrolled. ?  ?Once you have received you monitor, please review enclosed instructions.  Your monitor has already been registered assigning a specific monitor serial # to you. ?  ?Applying the monitor  ? ?Shave hair from upper left chest. ?   ?Hold abrader disc by orange tab.  Rub abrader in 40 strokes over left upper chest as indicated in your monitor instructions. ?  ?Clean area with 4 enclosed alcohol pads .  Use all pads to assure are is cleaned

## 2021-07-06 NOTE — Progress Notes (Unsigned)
Enrolled patient for a 14 day Zio XT  monitor to be mailed to patients home  °

## 2021-07-06 NOTE — Patient Instructions (Signed)
Medication Instructions:  ?No Changes In Medications at this time.  ?*If you need a refill on your cardiac medications before your next appointment, please call your pharmacy* ? ?Testing/Procedures: ?Your physician has requested that you have an echocardiogram. Echocardiography is a painless test that uses sound waves to create images of your heart. It provides your doctor with information about the size and shape of your heart and how well your heart?s chambers and valves are working. You may receive an ultrasound enhancing agent through an IV if needed to better visualize your heart during the echo.This procedure takes approximately one hour. There are no restrictions for this procedure. This will take place at the [address].  ? ? ?ZIO XT- Long Term Monitor Instructions  ? ?Your physician has requested you wear your ZIO patch monitor___14____days.  ? ?This is a single patch monitor.  [employer] supplies one patch monitor per enrollment.  Additional stickers are not available. ?  ?Please do not apply patch if you will be having a Nuclear Stress Test, Echocardiogram, Cardiac CT, MRI, or Chest Xray during the time frame you would be wearing the monitor. The patch cannot be worn during these tests.  You cannot remove and re-apply the ZIO XT patch monitor. ?  ?Your ZIO patch monitor will be sent USPS Priority mail from [facility] directly to your home address. The monitor may also be mailed to a PO BOX if home delivery is not available.   It may take 3-5 days to receive your monitor after you have been enrolled. ?  ?Once you have received you monitor, please review enclosed instructions.  Your monitor has already been registered assigning a specific monitor serial # to you. ?  ?Applying the monitor  ? ?Shave hair from upper left chest. ?  ?Hold abrader disc by orange tab.  Rub abrader in 40 strokes over left upper chest as indicated in your monitor instructions. ?  ?Clean area with 4 enclosed  alcohol pads .  Use all pads to assure are is cleaned thoroughly.  Let dry.  ? ?Apply patch as indicated in monitor instructions.  Patch will be place under collarbone on left side of chest with arrow pointing upward. ?  ?Rub patch adhesive wings for 2 minutes.Remove white label marked "1".  Remove white label marked "2".  Rub patch adhesive wings for 2 additional minutes. ?  ?While looking in a mirror, press and release button in center of patch.  A small green light will flash 3-4 times .  This will be your only indicator the monitor has been turned on. ?    ?Do not shower for the first 24 hours.  You may shower after the first 24 hours. ?  ?Press button if you feel a symptom. You will hear a small click.  Record Date, Time and Symptom in the Patient Log Book. ?  ?When you are ready to remove patch, follow instructions on last 2 pages of Patient Log Book.  Stick patch monitor onto last page of Patient Log Book. ?  ?Place Patient Log Book in [facility] box.  Use locking tab on box and tape box closed securely.  The [employer] has [employer] on it.  Please place in mailbox as soon as possible.  Your physician should have your test results approximately 7 days after the monitor has been mailed back to [facility]. ?  ?Call [facility] at [phone] if you have questions regarding your  ZIO XT patch monitor.  Call them immediately if you see an orange light blinking on your monitor. ?  ?If your monitor falls off in less than 4 days contact our Monitor department at [phone].  If your monitor becomes loose or falls off after 4 days call [employer] at [phone] for suggestions on securing your monitor.  ? ?Follow-Up: ?At [facility], you and your health needs are our priority.  As part of our continuing mission to provide you with exceptional heart care, we have created designated Provider Care Teams.  These Care Teams include your primary Cardiologist (physician) and Advanced  Practice Providers (APPs -  Physician Assistants and Nurse Practitioners) who all work together to provide you with the care you need, when you need it. ? ?Your next appointment:   ?AS NEEDED  ? ?The format for your next appointment:   ?In Person ? ?Provider:   ?Parke Poisson, MD   ? ? ? ? ?  ?

## 2021-07-07 ENCOUNTER — Other Ambulatory Visit: Payer: Self-pay

## 2021-07-07 ENCOUNTER — Ambulatory Visit: Payer: PRIVATE HEALTH INSURANCE | Admitting: Internal Medicine

## 2021-07-07 ENCOUNTER — Encounter: Payer: Self-pay | Admitting: Internal Medicine

## 2021-07-07 VITALS — BP 123/79 | HR 75 | Temp 98.3°F | Ht 66.0 in | Wt 201.0 lb

## 2021-07-07 DIAGNOSIS — D72819 Decreased white blood cell count, unspecified: Secondary | ICD-10-CM | POA: Diagnosis not present

## 2021-07-07 DIAGNOSIS — Z8619 Personal history of other infectious and parasitic diseases: Secondary | ICD-10-CM | POA: Diagnosis not present

## 2021-07-07 NOTE — Patient Instructions (Signed)
I do not have high suspicion you have any immune deficiency ? ?Certainly will have to follow the white blood cell count over the next several months before asking hematology to comment if it continues to be low.  ? ?At this point given the wbc level I do not have a concern they are contributing to increased risk for infection.  ? ?She has so far isolated slightly abnormal rheumatologic labs that her rheumatology is looking into ? ? ?I would like to see previous ID testings from [place] and we can go forward from there  ?

## 2021-07-07 NOTE — Progress Notes (Signed)
?  ? ? ? ? ?[facility] for Infectious Disease ? ?Reason for Consult: frequent infection ?Referring Provider: Estanislado Pandy ? ? ? ?Patient Active Problem List  ? Diagnosis Date Noted  ? Vulvitis   ? Oligomenorrhea   ? MASTITIS 07/23/2009  ? DIZZINESS 07/23/2009  ? PALPITATIONS 07/23/2009  ? SHORTNESS OF BREATH 07/23/2009  ? ? ? ? ?HPI: [name] is a 41 y.o. female referred by her rheumatologist for frequent infection ? ?She has been seeing [facility] since 2016. She was referred to rheumatology for positive ANA and dsDNA antibody.  ? 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  ? ?She reports chronic fatigue and some joint pain and in setting of the rheum labs were referred to rheumatologist, who referred her here for the "frequent infection." ? ?She was treated for lyme twice at the [facility]. Last course was 11/2020. She was on a "protocol."  ? ?She received another "protocol" combination of antibiotics for bartonella in 02/2021.  ? ?She had an unremarkable teenage years growing up until she sees [facility] in terms of infection ? ?She does have since 2010 recurrent dry cough and occasional feeling unwell and started seeing the [facility] ? ?Her husband had burkitt lymphoma and was dx'ed with lymphoma in 2015 ? ? ?She denies smoking or around people who smoke ? ?She has 3 children. Youngest son is 29; she has 2 older boys 4 and 11. They have been healthy ? 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 ?She has an ob-gyn doc but no internal medicine or family practice medicine primary care ? ? ?Review of Systems: ?ROS ?All other ros negative ? ? ? ? ? ? ? ?Past Medical History:  ?Diagnosis Date  ? [place] Patient infection   ? Hypotension   ? Lyme disease   ? per patient  ? Mastitis   ? Oligomenorrhea   ? PCOS (polycystic ovarian syndrome)   ? Pre-syncope   ? Torticollis   ? as a baby, surgically corrected  ?  Vulvitis   ? ? ?Social History  ? ?Tobacco Use  ? Smoking status: Never  ?  Passive exposure: Never  ? Smokeless tobacco: Never  ?Vaping Use  ? Vaping Use: Never used  ?Substance Use Topics  ? Alcohol use: No  ? Drug use: No  ? ? ?Family History  ?Problem Relation Age of Onset  ? Hypertension Mother   ? Diabetes Father   ? Hypertension Father   ? Autoimmune disease Sister   ? Emphysema Maternal Grandmother   ? Emphysema Paternal Grandmother   ? Cancer Paternal Grandmother   ? Healthy Son   ? Healthy Son   ? Healthy Son   ? Heart murmur Neg Hx   ?     UNCLE  ? Hyperthyroidism Neg Hx   ?     grandmother  ? ? ?Allergies  ?Allergen Reactions  ? Bactrim   ?  Fever, muscle ache.  ? Vancomycin   ?  RED MAN REACTION  ? Amoxicillin-Pot Clavulanate Rash  ? ? ?OBJECTIVE: ?Vitals:  ? 07/07/21 1501  ?BP: 123/79  ?Pulse: 75  ?Temp: 98.3 ?F (36.8 ?C)  ?TempSrc: Oral  ?SpO2: 97%  ?Weight: 201 lb (91.2 kg)  ?Height: '5\' 6"'  (1.676 m)  ? ?Body mass index is 32.44 kg/m?. ? ? ?Physical Exam ?General/constitutional: no distress, pleasant ?HEENT: Normocephalic, PER, Conj Clear, EOMI, Oropharynx clear ?  Neck supple ?CV: rrr no mrg ?Lungs: clear to auscultation, normal respiratory effort ?Abd: Soft, Nontender ?Ext: no edema ?Skin: No Rash ?Neuro: nonfocal ?MSK: no peripheral joint swelling/tenderness/warmth; back spines nontender ? ? ?Lab: ?Lab Results  ?Component Value Date  ? WBC 3.5 (L) 06/30/2021  ? HGB 12.3 06/30/2021  ? HCT 38.6 06/30/2021  ? MCV 88.7 06/30/2021  ? PLT 277 06/30/2021  ? ?Last metabolic panel ?Lab Results  ?Component Value Date  ? GLUCOSE 65 06/30/2021  ? NA 141 06/30/2021  ? K 4.0 06/30/2021  ? CL 104 06/30/2021  ? CO2 27 06/30/2021  ? BUN 11 06/30/2021  ? CREATININE 0.58 06/30/2021  ? EGFR 116 06/30/2021  ? CALCIUM 9.5 06/30/2021  ? PROT 7.3 06/30/2021  ? PROT 7.2 06/30/2021  ? ALBUMIN 4.2 03/06/2021  ? BILITOT 0.2 06/30/2021  ? ALKPHOS 69 03/06/2021  ? AST 18 06/30/2021  ? ALT 20 06/30/2021  ? ANIONGAP 8 03/06/2021   ? ? ?Microbiology: ? ?Serology: ? ?Imaging: ? ? ?Assessment/plan: ?Problem List Items Addressed This Visit   ?None ?Visit Diagnoses   ? ? History of recurrent infection    -  Primary  ? Leukopenia, unspecified type      ? ?  ? ?#leukopenia ?#hx recurrent infection ?Her wbc count was normal in 03/2021 in [facility], but on the lower side 02/2021 improving (obviously 03/2021) but slight drop again 06/30/2021 ?She denies any recent infection ?She is being worked up for positive ana/dsdna, although reading rheumatology note I don't have a sense of lupus being highly considered ? ?-isolated (unclear duration) wbc in itself without history of infections do not suggest immunodeficiency, and with the currently level I am not concerned she is at higher risk for infection ?-would like to see what testing were done while she was diagnosed with lyme, mycoplasma, ebv, and babesiosis, and bartonella all within a few years before I decide on further testing/evaluation. I have high suspicion she doesn't have a primary immunodeficiency ? ?-roi for record from [facility] ? ? ? ? ? ? ? ? ? ? ? ?Follow-up: Return in about 3 weeks (around 07/28/2021). ? ?Jabier Mutton, MD ?[facility] ?[place] ?[phone] pager   [phone] cell ?07/07/2021, 3:09 PM ? ?

## 2021-07-13 ENCOUNTER — Telehealth: Payer: Self-pay

## 2021-07-13 NOTE — Telephone Encounter (Signed)
Per Dr. Renold Don called [facility] for lab records. Will need results for lyme, EBV, Mycoplasma, Bartonella, Babesiosis.  ?Left voicemail with referral coordinator requesting labs be faxed to office for review. ?P: [phone] ?Juanita Laster, RMA  ?

## 2021-07-14 ENCOUNTER — Ambulatory Visit (HOSPITAL_COMMUNITY): Payer: PRIVATE HEALTH INSURANCE | Attending: Internal Medicine

## 2021-07-14 DIAGNOSIS — R002 Palpitations: Secondary | ICD-10-CM | POA: Diagnosis not present

## 2021-07-14 LAB — ECHOCARDIOGRAM COMPLETE
Area-P 1/2: 4.39 cm2
S' Lateral: 3 cm

## 2021-07-15 NOTE — Telephone Encounter (Signed)
Attempted to call medical records today to request additional labs. Left voicemail. ?Faxed medical records list of labs provider is requesting. ?Juanita Laster, RMA  ?

## 2021-07-17 DIAGNOSIS — R002 Palpitations: Secondary | ICD-10-CM | POA: Diagnosis not present

## 2021-07-28 ENCOUNTER — Telehealth: Payer: PRIVATE HEALTH INSURANCE | Admitting: Internal Medicine

## 2021-07-29 ENCOUNTER — Ambulatory Visit: Payer: PRIVATE HEALTH INSURANCE | Admitting: Rheumatology

## 2021-08-16 ENCOUNTER — Other Ambulatory Visit: Payer: Self-pay

## 2021-08-16 ENCOUNTER — Telehealth: Payer: Self-pay | Admitting: Internal Medicine

## 2021-08-16 ENCOUNTER — Ambulatory Visit (INDEPENDENT_AMBULATORY_CARE_PROVIDER_SITE_OTHER): Payer: PRIVATE HEALTH INSURANCE | Admitting: Internal Medicine

## 2021-08-16 DIAGNOSIS — D72819 Decreased white blood cell count, unspecified: Secondary | ICD-10-CM

## 2021-08-16 NOTE — Telephone Encounter (Signed)
Medical release refaxed and left another voice message to laboratory team for urgent request.

## 2021-08-16 NOTE — Telephone Encounter (Signed)
Another call and voicemail left for lab at [facility] for medical records. Release has been signed and faxed last month. I have also communicated this information to the patient. She was understanding of why appointment was canceled due to needing additional information to proceed with plan. Valarie Cones

## 2021-08-18 NOTE — Telephone Encounter (Signed)
Records received and placed in Dr. Orlando Penner box.

## 2021-08-18 NOTE — Progress Notes (Signed)
Office Visit Note  Patient: [name]             Date of Birth: 1980-06-14           MRN: [id number]             PCP: Pcp, No Referring: No ref. provider found Visit Date: 08/26/2021 Occupation: '@GUAROCC' @  Subjective:  Pain in joints and fatigue  History of Present Illness: [name] is a 41 y.o. female with history of osteoarthritis, and positive ANA returns for follow-up visit.  She states she continues to have pain and stiffness in her bilateral hands and bilateral feet.  She has not noticed any joint swelling.  She has episodic increased generalized pain and discomfort with fatigue.  She states the symptoms usually happen just before menstrual cycle.  She was evaluated by Dr. Gale Journey for history of recurrent infections.  He did not suspect that she has primary immunodeficiency.  She states that she will have a follow-up televisit with Dr. Gale Journey after he receives all the labs from [place].  Activities of Daily Living:  Patient reports morning stiffness for 3 minutes.   Patient Reports nocturnal pain.  Difficulty dressing/grooming: Denies Difficulty climbing stairs: Denies Difficulty getting out of chair: Denies Difficulty using hands for taps, buttons, cutlery, and/or writing: Denies  Review of Systems  Constitutional:  Positive for fatigue.  HENT:  Negative for mouth dryness.   Eyes:  Positive for dryness.  Respiratory:  Negative for shortness of breath.   Cardiovascular:  Positive for swelling in legs/feet.  Gastrointestinal:  Negative for constipation.  Endocrine: Negative for excessive thirst.  Genitourinary:  Negative for difficulty urinating.  Musculoskeletal:  Positive for joint pain, joint pain, joint swelling, morning stiffness and muscle tenderness.  Skin:  Negative for rash.  Allergic/Immunologic: Negative for susceptible to infections.  Neurological:  Negative for numbness.  Hematological:  Negative for bruising/bleeding tendency.   Psychiatric/Behavioral:  Positive for sleep disturbance.     PMFS History:  Patient Active Problem List   Diagnosis Date Noted   Vulvitis    Oligomenorrhea    MASTITIS 07/23/2009   DIZZINESS 07/23/2009   PALPITATIONS 07/23/2009   SHORTNESS OF BREATH 07/23/2009    Past Medical History:  Diagnosis Date   Epstein Barr infection    Hypotension    Lyme disease    per patient   Mastitis    Oligomenorrhea    PCOS (polycystic ovarian syndrome)    Pre-syncope    Torticollis    as a baby, surgically corrected   Vulvitis     Family History  Problem Relation Age of Onset   Hypertension Mother    Diabetes Father    Hypertension Father    Autoimmune disease Sister    Emphysema Maternal Grandmother    Emphysema Paternal Grandmother    Cancer Paternal Grandmother    Healthy Son    Healthy Son    Healthy Son    Heart murmur Neg Hx        UNCLE   Hyperthyroidism Neg Hx        grandmother   Past Surgical History:  Procedure Laterality Date   CESAREAN SECTION     NECK SURGERY     RIGHT NECK SURGERY   TONSILLECTOMY AND ADENOIDECTOMY     TUBAL LIGATION     TYMPANOSTOMY TUBE PLACEMENT     WISDOM TOOTH EXTRACTION     Social History   Social History Narrative   Not on file  Immunization History  Administered Date(s) Administered   PFIZER(Purple Top)SARS-COV-2 Vaccination 10/16/2019, 11/06/2019     Objective: Vital Signs: BP 131/82 (BP Location: Left Arm, Patient Position: Sitting, Cuff Size: Normal)   Pulse 79   Resp 15   Ht 5' 6.5" (1.689 m)   Wt 202 lb (91.6 kg)   BMI 32.12 kg/m    Physical Exam Vitals and nursing note reviewed.  Constitutional:      Appearance: She is well-developed.  HENT:     Head: Normocephalic and atraumatic.  Eyes:     Conjunctiva/sclera: Conjunctivae normal.  Cardiovascular:     Rate and Rhythm: Normal rate and regular rhythm.     Heart sounds: Normal heart sounds.  Pulmonary:     Effort: Pulmonary effort is normal.     Breath  sounds: Normal breath sounds.  Abdominal:     General: Bowel sounds are normal.     Palpations: Abdomen is soft.  Musculoskeletal:     Cervical back: Normal range of motion.  Lymphadenopathy:     Cervical: No cervical adenopathy.  Skin:    General: Skin is warm and dry.     Capillary Refill: Capillary refill takes less than 2 seconds.  Neurological:     Mental Status: She is alert and oriented to person, place, and time.  Psychiatric:        Behavior: Behavior normal.      Musculoskeletal Exam: C-spine was in good range of motion.  Shoulder joints, elbow joints, wrist joints, MCPs been good range of motion.  She has some hypermobility at elbow joints, MCPs and PIPs.  Bilateral DIP thickening was noted.  No synovitis was noted.  Hip joints and knee joints with good range of motion.  She had no tenderness over ankles.  Right first MTP thickening consistent with hallux rigidus was noted.  CDAI Exam: CDAI Score: -- Patient Global: --; Provider Global: -- Swollen: --; Tender: -- Joint Exam 08/26/2021   No joint exam has been documented for this visit   There is currently no information documented on the homunculus. Go to the Rheumatology activity and complete the homunculus joint exam.  Investigation: No additional findings.  Imaging: No results found.  Recent Labs: Lab Results  Component Value Date   WBC 3.5 (L) 06/30/2021   HGB 12.3 06/30/2021   PLT 277 06/30/2021   NA 141 06/30/2021   K 4.0 06/30/2021   CL 104 06/30/2021   CO2 27 06/30/2021   GLUCOSE 65 06/30/2021   BUN 11 06/30/2021   CREATININE 0.58 06/30/2021   BILITOT 0.2 06/30/2021   ALKPHOS 69 03/06/2021   AST 18 06/30/2021   ALT 20 06/30/2021   PROT 7.3 06/30/2021   PROT 7.2 06/30/2021   ALBUMIN 4.2 03/06/2021   CALCIUM 9.5 06/30/2021   GFRAA  09/19/2008    >60        The eGFR has been calculated using the MDRD equation. This calculation has not been validated in all clinical situations. eGFR's  persistently <60 mL/min signify possible Chronic Kidney Disease.   09/25/20: ANA+, dsDNA 14, ESR 34, G6PD 11.7, RF<10     Speciality Comments: No specialty comments available.  Procedures:  No procedures performed Allergies: Bactrim, Vancomycin, and Amoxicillin-pot clavulanate   Assessment / Plan:     Visit Diagnoses: Polyarthralgia - She has pain in multiple joints and muscles over many years per patient.  Primary osteoarthritis of both hands - Clinical and radiographic findings are consistent with osteoarthritis.  She had  DIP thickening in her hands.  She also has some hypermobility in her joints which makes it prone towards osteoarthritis.  Detailed counseling on osteoarthritis was provided.  A handout on hand exercises was given.  A list of natural anti-inflammatories was given.  Primary osteoarthritis of both feet - Clinical and radiographic findings were consistent with osteoarthritis.  She has right first toe hallux rigidus.  I advised the patient to schedule an appointment with the podiatrist.  Positive ANA (antinuclear antibody) - September 25, 2020 ANA positive, double-stranded ENA 14. Aug 11, 2021 AVISE lupus index -1.0, ANA positive, titer negative, ENA negative, CB-CAP Negative, Jo 1 negative, anticardiolipin negative, beta-2 GP 1 negative, antiphosphatidylserine negative, anti-C1q positive, RF negative, anti-CCP negative, anti-carP negative, antithyroglobulin positive, anti-TPO negative.  Lab findings were discussed with the patient at length.  She does not have any clinical features of lupus.  She denies history of oral ulcers, nasal ulcers, malar rash, photosensitivity, Raynaud's phenomenon or lymphadenopathy.  There is no evidence of inflammatory arthritis.  I advised her to contact me if she develops any new symptoms.  Myofascial pain-she complains of episodic increased generalized pain.  She is under a lot of stress.  She has generalized pain, hyperalgesia and positive tender  points.  I will refer her to integrative therapies.  She may benefit from use of Cymbalta.  Benefits of Cymbalta for myofascial pain and osteoarthritis pain was discussed.  Patient will discuss with her PCP.  Other fatigue - History of fatigue for many years.  Palpitations - History of palpitations for the last few years.  Recurrent infections - Followed at [facility] since 2016.  Treated for EBV, mycoplasma, Bartonella, babesiosis, Lyme disease x2.  She was evaluated by Dr Gale Journey and will have a follow-up visit after he receives all the labs from [facility]..  Elevated LFTs - Repeat LFTs normal on June 30, 2021.  Elevated LFTs most likely related to antibiotic use.  Other neutropenia ([place]) - June 30, 2021 WBC count was better at 3.5.  Neutropenia could be related to antibiotic use as well.  I advised her to schedule an appointment with a PCP and have repeat CBC in the future.  History of hypothyroidism - According the patient she is on Synthroid for fatigue.  She would like to see an endocrinologist for evaluation.  I will place a referral for her.  Polycystic ovaries  Orders: No orders of the defined types were placed in this encounter.  No orders of the defined types were placed in this encounter.   Face-to-face time spent with patient was 35 minutes. Greater than 50% of time was spent in counseling and coordination of care.  Follow-Up Instructions: Return if symptoms worsen or fail to improve, for Osteoarthritis.   Bo Merino, MD  Note - This record has been created using Editor, commissioning.  Chart creation errors have been sought, but may not always  have been located. Such creation errors do not reflect on  the standard of medical care.

## 2021-08-26 ENCOUNTER — Ambulatory Visit (INDEPENDENT_AMBULATORY_CARE_PROVIDER_SITE_OTHER): Payer: PRIVATE HEALTH INSURANCE | Admitting: Rheumatology

## 2021-08-26 ENCOUNTER — Encounter: Payer: Self-pay | Admitting: Rheumatology

## 2021-08-26 VITALS — BP 131/82 | HR 79 | Resp 15 | Ht 66.5 in | Wt 202.0 lb

## 2021-08-26 DIAGNOSIS — M19042 Primary osteoarthritis, left hand: Secondary | ICD-10-CM

## 2021-08-26 DIAGNOSIS — M7918 Myalgia, other site: Secondary | ICD-10-CM

## 2021-08-26 DIAGNOSIS — E282 Polycystic ovarian syndrome: Secondary | ICD-10-CM

## 2021-08-26 DIAGNOSIS — M19072 Primary osteoarthritis, left ankle and foot: Secondary | ICD-10-CM

## 2021-08-26 DIAGNOSIS — M2550 Pain in unspecified joint: Secondary | ICD-10-CM | POA: Diagnosis not present

## 2021-08-26 DIAGNOSIS — B999 Unspecified infectious disease: Secondary | ICD-10-CM

## 2021-08-26 DIAGNOSIS — Z8639 Personal history of other endocrine, nutritional and metabolic disease: Secondary | ICD-10-CM

## 2021-08-26 DIAGNOSIS — R7989 Other specified abnormal findings of blood chemistry: Secondary | ICD-10-CM

## 2021-08-26 DIAGNOSIS — D708 Other neutropenia: Secondary | ICD-10-CM

## 2021-08-26 DIAGNOSIS — R5383 Other fatigue: Secondary | ICD-10-CM

## 2021-08-26 DIAGNOSIS — R768 Other specified abnormal immunological findings in serum: Secondary | ICD-10-CM

## 2021-08-26 DIAGNOSIS — M19041 Primary osteoarthritis, right hand: Secondary | ICD-10-CM | POA: Diagnosis not present

## 2021-08-26 DIAGNOSIS — M19071 Primary osteoarthritis, right ankle and foot: Secondary | ICD-10-CM | POA: Diagnosis not present

## 2021-08-26 DIAGNOSIS — R0602 Shortness of breath: Secondary | ICD-10-CM

## 2021-08-26 DIAGNOSIS — R002 Palpitations: Secondary | ICD-10-CM

## 2021-08-26 NOTE — Patient Instructions (Signed)
Hand Exercises Hand exercises can be helpful for almost anyone. These exercises can strengthen the hands, improve flexibility and movement, and increase blood flow to the hands. These results can make work and daily tasks easier. Hand exercises can be especially helpful for people who have joint pain from arthritis or have nerve damage from overuse (carpal tunnel syndrome). These exercises can also help people who have injured a hand. Exercises Most of these hand exercises are gentle stretching and motion exercises. It is usually safe to do them often throughout the day. Warming up your hands before exercise may help to reduce stiffness. You can do this with gentle massage or by placing your hands in warm water for 10-15 minutes. It is normal to feel some stretching, pulling, tightness, or mild discomfort as you begin new exercises. This will gradually improve. Stop an exercise right away if you feel sudden, severe pain or your pain gets worse. Ask your health care provider which exercises are best for you. Knuckle bend or "claw" fist  Stand or sit with your arm, hand, and all five fingers pointed straight up. Make sure to keep your wrist straight during the exercise. Gently bend your fingers down toward your palm until the tips of your fingers are touching the top of your palm. Keep your big knuckle straight and just bend the small knuckles in your fingers. Hold this position for __________ seconds. Straighten (extend) your fingers back to the starting position. Repeat this exercise 5-10 times with each hand. Full finger fist  Stand or sit with your arm, hand, and all five fingers pointed straight up. Make sure to keep your wrist straight during the exercise. Gently bend your fingers into your palm until the tips of your fingers are touching the middle of your palm. Hold this position for __________ seconds. Extend your fingers back to the starting position, stretching every joint fully. Repeat  this exercise 5-10 times with each hand. Straight fist Stand or sit with your arm, hand, and all five fingers pointed straight up. Make sure to keep your wrist straight during the exercise. Gently bend your fingers at the big knuckle, where your fingers meet your hand, and the middle knuckle. Keep the knuckle at the tips of your fingers straight and try to touch the bottom of your palm. Hold this position for __________ seconds. Extend your fingers back to the starting position, stretching every joint fully. Repeat this exercise 5-10 times with each hand. Tabletop  Stand or sit with your arm, hand, and all five fingers pointed straight up. Make sure to keep your wrist straight during the exercise. Gently bend your fingers at the big knuckle, where your fingers meet your hand, as far down as you can while keeping the small knuckles in your fingers straight. Think of forming a tabletop with your fingers. Hold this position for __________ seconds. Extend your fingers back to the starting position, stretching every joint fully. Repeat this exercise 5-10 times with each hand. Finger spread  Place your hand flat on a table with your palm facing down. Make sure your wrist stays straight as you do this exercise. Spread your fingers and thumb apart from each other as far as you can until you feel a gentle stretch. Hold this position for __________ seconds. Bring your fingers and thumb tight together again. Hold this position for __________ seconds. Repeat this exercise 5-10 times with each hand. Making circles  Stand or sit with your arm, hand, and all five fingers pointed   straight up. Make sure to keep your wrist straight during the exercise. Make a circle by touching the tip of your thumb to the tip of your index finger. Hold for __________ seconds. Then open your hand wide. Repeat this motion with your thumb and each finger on your hand. Repeat this exercise 5-10 times with each hand. Thumb  motion  Sit with your forearm resting on a table and your wrist straight. Your thumb should be facing up toward the ceiling. Keep your fingers relaxed as you move your thumb. Lift your thumb up as high as you can toward the ceiling. Hold for __________ seconds. Bend your thumb across your palm as far as you can, reaching the tip of your thumb for the small finger (pinkie) side of your palm. Hold for __________ seconds. Repeat this exercise 5-10 times with each hand. Grip strengthening  Hold a stress ball or other soft ball in the middle of your hand. Slowly increase the pressure, squeezing the ball as much as you can without causing pain. Think of bringing the tips of your fingers into the middle of your palm. All of your finger joints should bend when doing this exercise. Hold your squeeze for __________ seconds, then relax. Repeat this exercise 5-10 times with each hand. Contact a health care provider if: Your hand pain or discomfort gets much worse when you do an exercise. Your hand pain or discomfort does not improve within 2 hours after you exercise. If you have any of these problems, stop doing these exercises right away. Do not do them again unless your health care provider says that you can. Get help right away if: You develop sudden, severe hand pain or swelling. If this happens, stop doing these exercises right away. Do not do them again unless your health care provider says that you can. This information is not intended to replace advice given to you by your health care provider. Make sure you discuss any questions you have with your health care provider. Document Revised: 06/18/2020 Document Reviewed: 06/18/2020 Elsevier Patient Education  2023 Elsevier Inc.  

## 2021-08-27 ENCOUNTER — Other Ambulatory Visit: Payer: Self-pay | Admitting: *Deleted

## 2021-08-27 DIAGNOSIS — M7918 Myalgia, other site: Secondary | ICD-10-CM

## 2021-08-27 DIAGNOSIS — Z8639 Personal history of other endocrine, nutritional and metabolic disease: Secondary | ICD-10-CM

## 2021-09-23 ENCOUNTER — Telehealth: Payer: Self-pay | Admitting: Internal Medicine

## 2021-09-23 NOTE — Telephone Encounter (Signed)
Follow Up:     Patient said she wore a Monitor in May.. she said she have never received her results.

## 2021-09-23 NOTE — Telephone Encounter (Signed)
Pt updated with monitor results and verbalized understanding.  

## 2021-09-28 ENCOUNTER — Other Ambulatory Visit: Payer: Self-pay | Admitting: Obstetrics and Gynecology

## 2021-09-28 DIAGNOSIS — Z1231 Encounter for screening mammogram for malignant neoplasm of breast: Secondary | ICD-10-CM

## 2021-12-03 ENCOUNTER — Telehealth: Payer: Self-pay | Admitting: Rheumatology

## 2021-12-03 NOTE — Telephone Encounter (Signed)
Patient called the office requesting a referral to [facility] at [facility] in [place]. Phone# [phone]

## 2021-12-03 NOTE — Telephone Encounter (Signed)
Referral faxed

## 2021-12-27 ENCOUNTER — Other Ambulatory Visit: Payer: Self-pay | Admitting: Obstetrics and Gynecology

## 2021-12-27 DIAGNOSIS — N6452 Nipple discharge: Secondary | ICD-10-CM

## 2022-10-25 IMAGING — MG MM DIGITAL DIAGNOSTIC UNILAT*R* W/ TOMO W/ CAD
4 series · 4 of 12 positions shown · non-contrast
Comparison: Previous exam(s).

CLINICAL DATA: 39-year-old female presenting for follow-up of a
likely benign right breast mass. She had a biopsy a mass in the left
breast in Tuesday August, 2019 with pathology indicating a benign
fibroadenoma.

EXAM:
DIGITAL DIAGNOSTIC UNILATERAL RIGHT MAMMOGRAM WITH TOMO AND CAD;
ULTRASOUND RIGHT BREAST LIMITED

[R CC synth-2D]
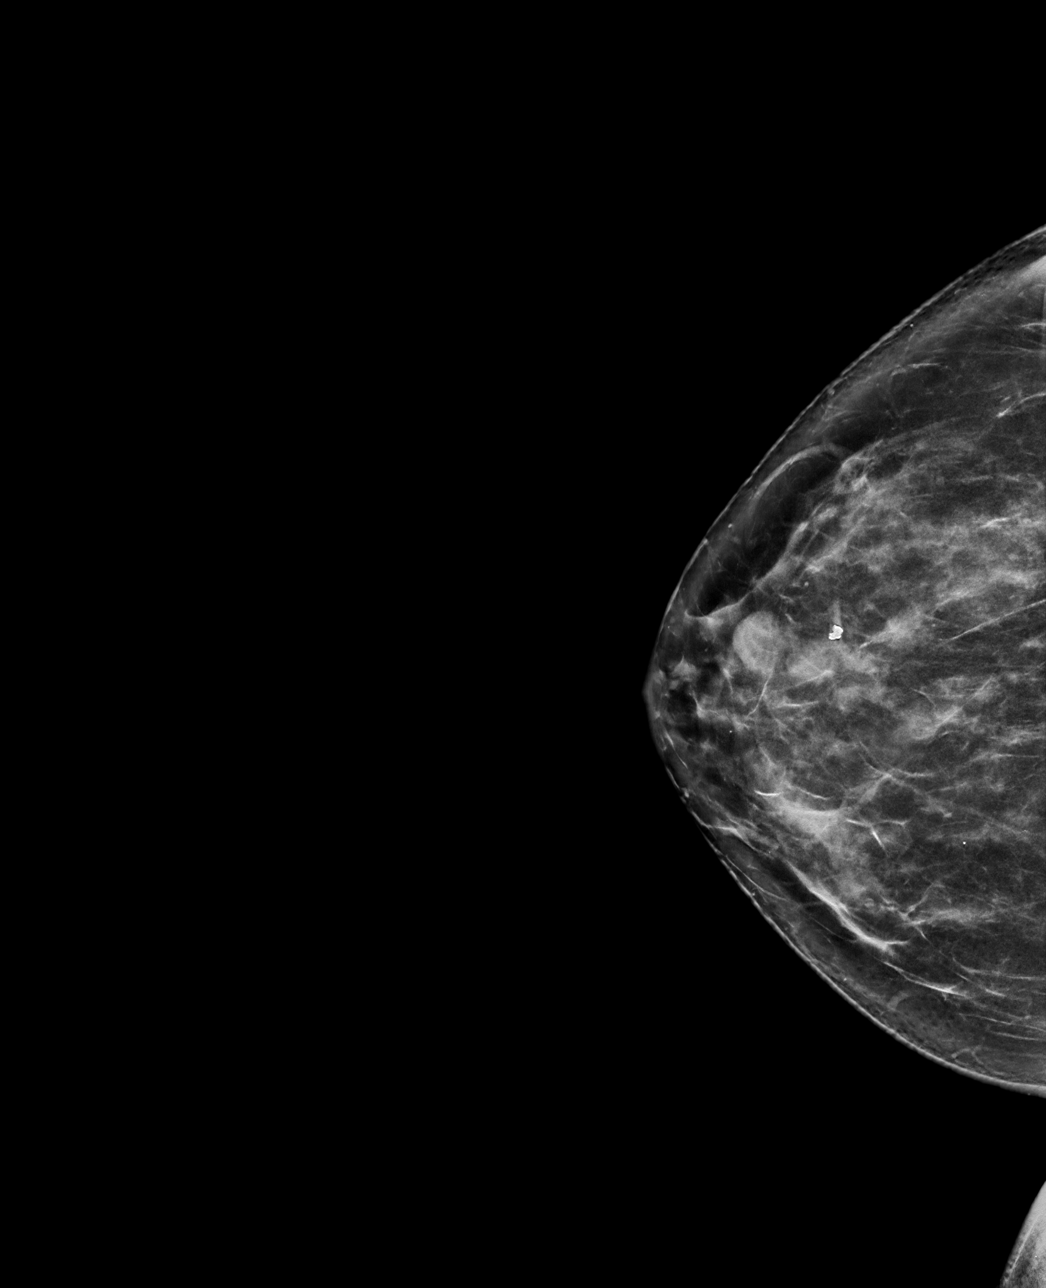

[R MLO synth-2D]
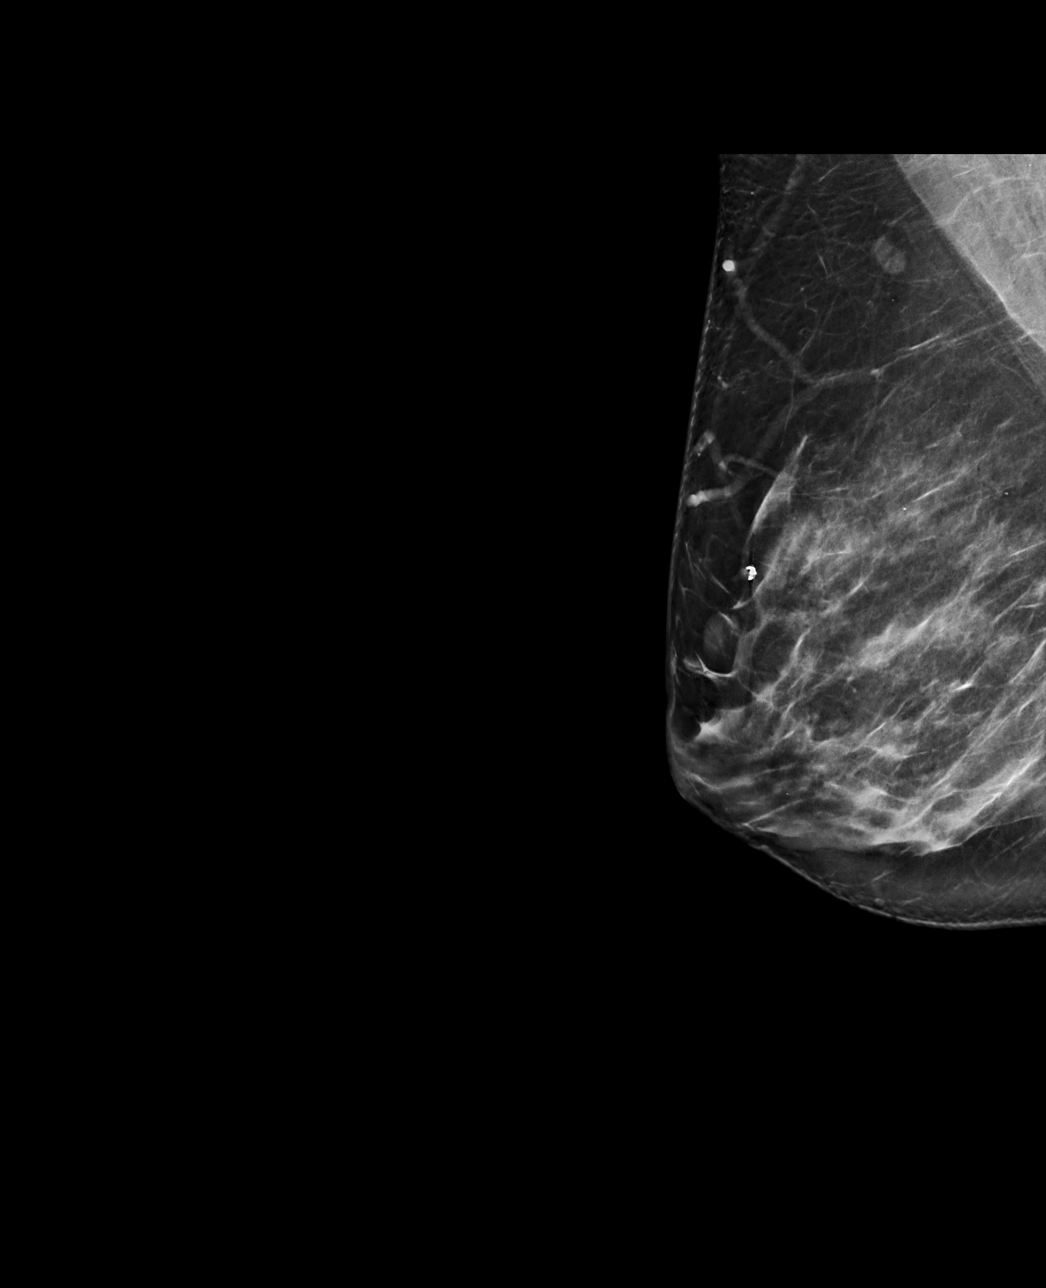

[R CC tomo · tomo slice 40/79.0]
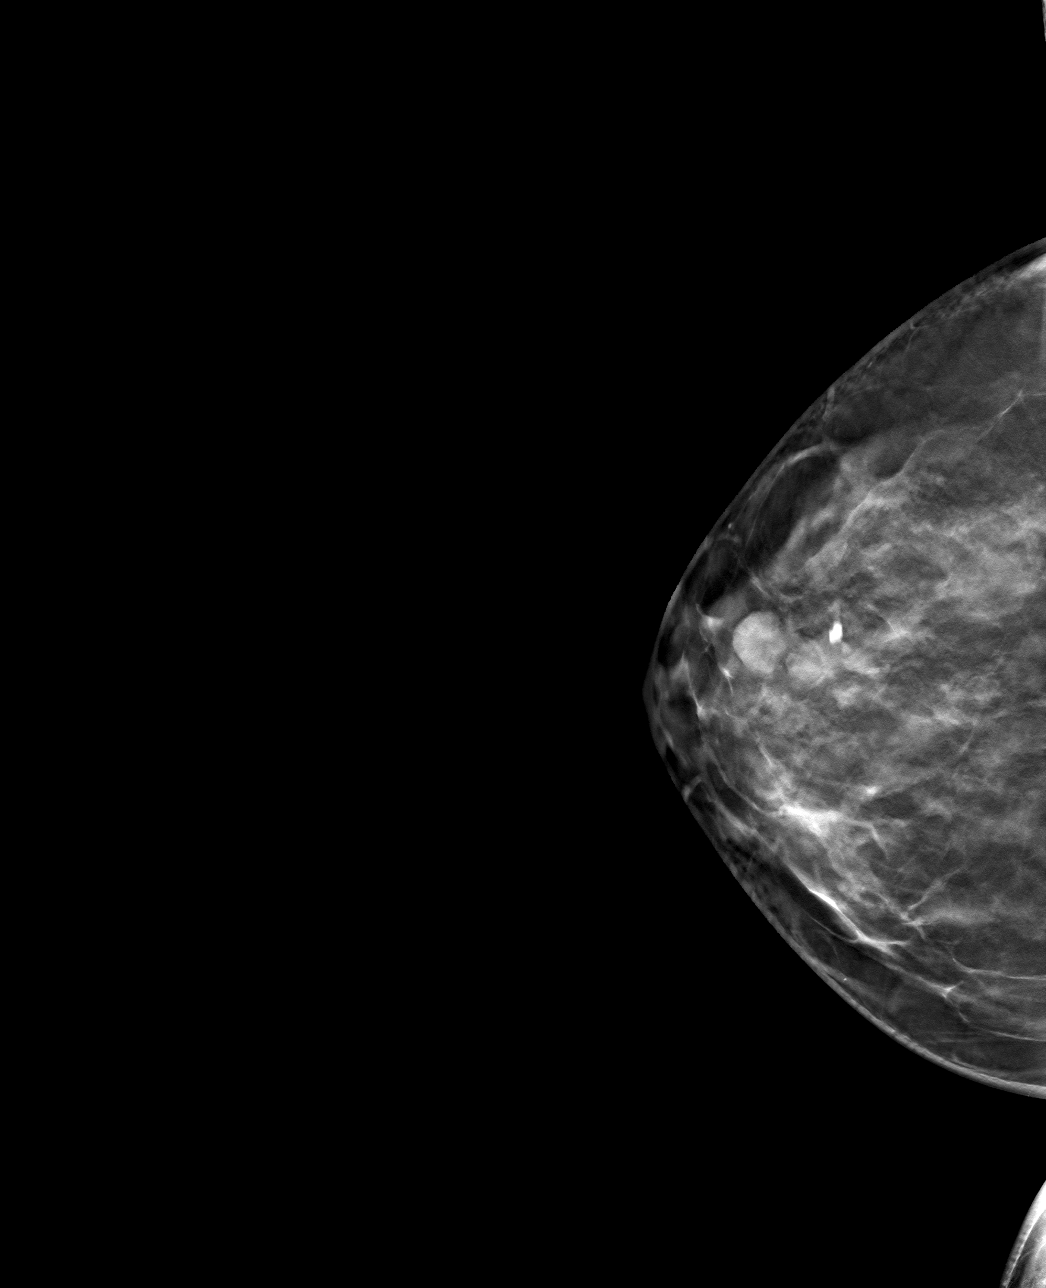

[R MLO tomo · tomo slice 47/94.0]
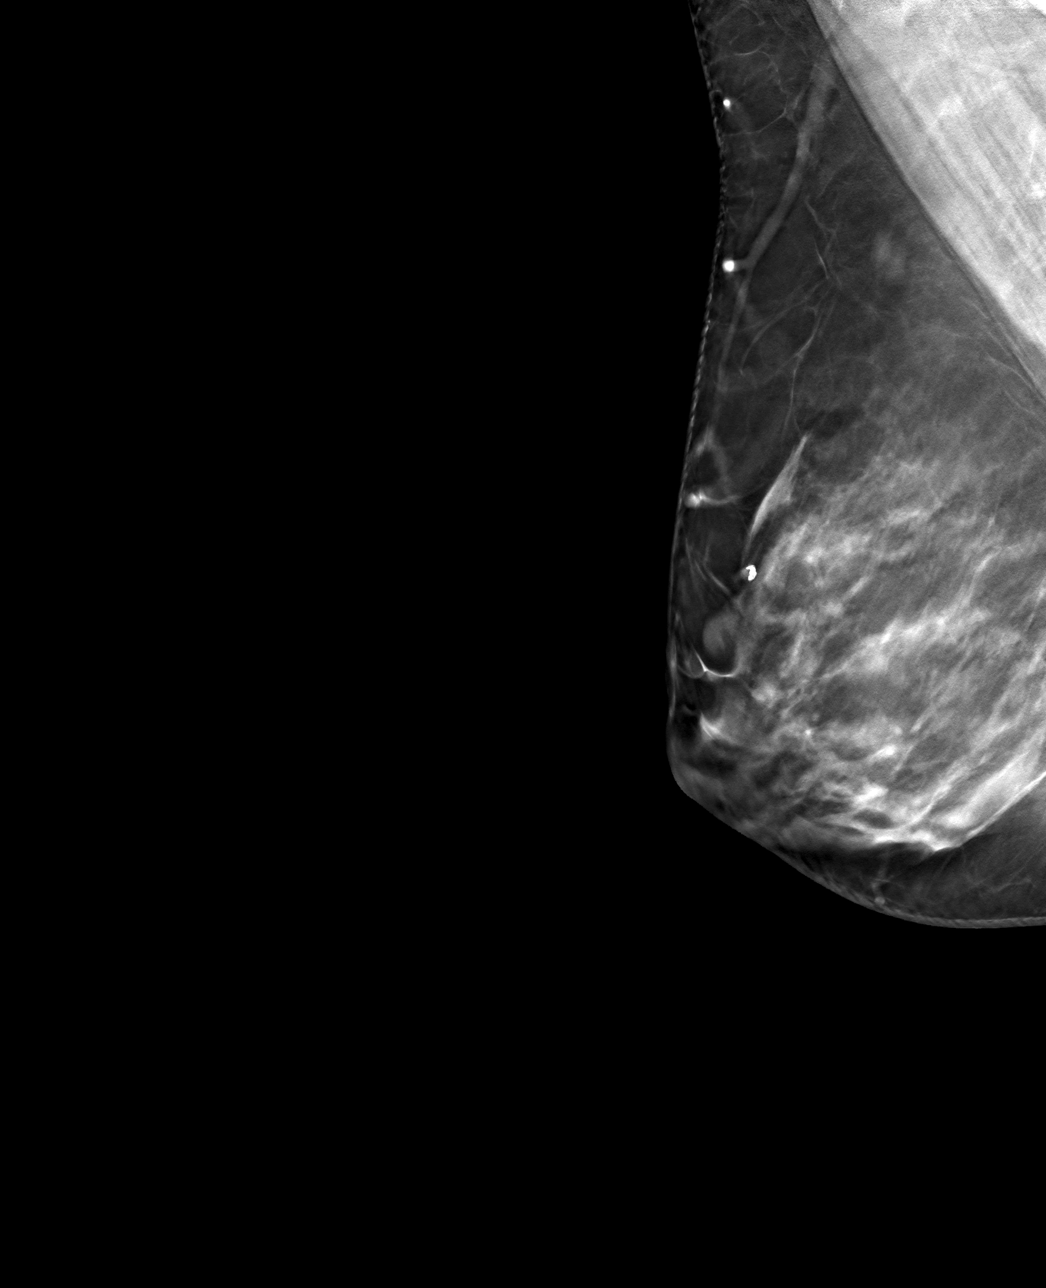

[4 of 12 positions shown; findings below may reference images not displayed]

ACR Breast Density Category c: The breast tissue is heterogeneously
dense, which may obscure small masses.
FINDINGS: The mass in the upper outer anterior right breast appears mildly
enlarged mammographically. The other oval circumscribed masses seen
in the right breast are stable. No new suspicious calcifications,
masses or areas of distortion are seen in the right breast.

Mammographic images were processed with CAD.

Ultrasound targeted to the right breast at 11 o'clock, 3 cm from the
nipple demonstrates that the oval circumscribed hypoechoic mass has
mildly increased in size measuring 1.2 x 0.7 x 1.1 cm, previously
1.0 x 0.5 x 0.9 cm.
IMPRESSION: Mild increase in size of the right breast mass at 11 o'clock.

RECOMMENDATION:
Ultrasound guided biopsy is recommended for the right breast mass at
11 o'clock. This has been scheduled for 02/26/2020 at [DATE] a.m.

I have discussed the findings and recommendations with the patient.
If applicable, a reminder letter will be sent to the patient
regarding the next appointment.

BI-RADS CATEGORY  4: Suspicious.

## 2023-03-08 NOTE — Progress Notes (Signed)
No show
